# Patient Record
Sex: Female | Born: 1995 | Race: Asian | Hispanic: No | Marital: Single
Health system: Midwestern US, Community
[De-identification: ages and names within clinical notes are randomized; demographics above are authoritative.]

## PROBLEM LIST (undated history)

## (undated) ENCOUNTER — Inpatient Hospital Stay (HOSPITAL_COMMUNITY): Payer: Medicaid Other

## (undated) DIAGNOSIS — F329 Major depressive disorder, single episode, unspecified: Secondary | ICD-10-CM

## (undated) DIAGNOSIS — D649 Anemia, unspecified: Secondary | ICD-10-CM

## (undated) DIAGNOSIS — F32A Depression, unspecified: Secondary | ICD-10-CM

## (undated) HISTORY — PX: NO PAST SURGERIES: SHX2092

---
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
## 2014-04-22 ENCOUNTER — Other Ambulatory Visit (HOSPITAL_COMMUNITY): Payer: Self-pay | Admitting: Nurse Practitioner

## 2014-04-22 DIAGNOSIS — Z3689 Encounter for other specified antenatal screening: Secondary | ICD-10-CM

## 2014-04-22 LAB — OB RESULTS CONSOLE HGB/HCT, BLOOD
HEMATOCRIT: 33 %
Hemoglobin: 11.2 g/dL

## 2014-04-22 LAB — OB RESULTS CONSOLE ABO/RH: RH Type: POSITIVE

## 2014-04-22 LAB — OB RESULTS CONSOLE PLATELET COUNT: PLATELETS: 241 10*3/uL

## 2014-04-22 LAB — OB RESULTS CONSOLE VARICELLA ZOSTER ANTIBODY, IGG: VARICELLA IGG: IMMUNE

## 2014-04-22 LAB — OB RESULTS CONSOLE RPR: RPR: NONREACTIVE

## 2014-04-22 LAB — CYSTIC FIBROSIS DIAGNOSTIC STUDY: Interpretation-CFDNA:: NEGATIVE

## 2014-04-22 LAB — OB RESULTS CONSOLE GC/CHLAMYDIA
CHLAMYDIA, DNA PROBE: NEGATIVE
Gonorrhea: NEGATIVE

## 2014-04-22 LAB — OB RESULTS CONSOLE ANTIBODY SCREEN: ANTIBODY SCREEN: NEGATIVE

## 2014-04-22 LAB — OB RESULTS CONSOLE HEPATITIS B SURFACE ANTIGEN: Hepatitis B Surface Ag: NEGATIVE

## 2014-04-22 LAB — OB RESULTS CONSOLE RUBELLA ANTIBODY, IGM: RUBELLA: IMMUNE

## 2014-04-22 LAB — OB RESULTS CONSOLE HIV ANTIBODY (ROUTINE TESTING): HIV: NONREACTIVE

## 2014-04-26 ENCOUNTER — Other Ambulatory Visit (HOSPITAL_COMMUNITY): Payer: Self-pay | Admitting: Nurse Practitioner

## 2014-04-26 ENCOUNTER — Ambulatory Visit (HOSPITAL_COMMUNITY)
Admission: RE | Admit: 2014-04-26 | Discharge: 2014-04-26 | Disposition: A | Discharge status: Home / Self Care | Payer: Medicaid Other | Source: Ambulatory Visit | Attending: Nurse Practitioner | Admitting: Nurse Practitioner

## 2014-04-26 ENCOUNTER — Encounter (HOSPITAL_COMMUNITY): Payer: Self-pay

## 2014-04-26 DIAGNOSIS — Z3689 Encounter for other specified antenatal screening: Secondary | ICD-10-CM | POA: Insufficient documentation

## 2014-05-02 ENCOUNTER — Other Ambulatory Visit (HOSPITAL_COMMUNITY): Payer: Self-pay | Admitting: Nurse Practitioner

## 2014-05-02 DIAGNOSIS — Z0374 Encounter for suspected problem with fetal growth ruled out: Secondary | ICD-10-CM

## 2014-05-20 ENCOUNTER — Encounter: Payer: Self-pay | Admitting: Family

## 2014-05-20 ENCOUNTER — Encounter: Payer: Medicaid Other | Admitting: Family

## 2014-05-29 ENCOUNTER — Encounter: Payer: Self-pay | Admitting: *Deleted

## 2014-05-31 ENCOUNTER — Ambulatory Visit (HOSPITAL_COMMUNITY): Admission: RE | Admit: 2014-05-31 | Payer: Medicaid Other | Source: Ambulatory Visit

## 2014-12-13 NOTE — L&D Delivery Note (Signed)
Misty Stanley, MD performed the delivery.   Delivery Note At 7:36 PM a viable and healthy female was delivered via Vaginal, Spontaneous Delivery (Presentation: ; Occiput Anterior).  APGAR: 8, 9; weight pending.   Placenta status: Intact, Spontaneous.  Cord: 3 vessels with the following complications: None.  Cord pH: N/A  Anesthesia: Epidural  Episiotomy: None Lacerations: None Suture Repair: none Est. Blood Loss (mL): 100  Mom to postpartum.  Baby to Couplet care / Skin to Skin.  De Hollingshead 07/16/2015, 8:32 PM

## 2015-02-27 ENCOUNTER — Encounter: Payer: Medicaid Other | Admitting: Physician Assistant

## 2015-03-28 ENCOUNTER — Encounter (HOSPITAL_COMMUNITY): Payer: Self-pay | Admitting: *Deleted

## 2015-04-14 ENCOUNTER — Other Ambulatory Visit (HOSPITAL_COMMUNITY): Payer: Self-pay | Admitting: Urology

## 2015-04-14 DIAGNOSIS — O0933 Supervision of pregnancy with insufficient antenatal care, third trimester: Secondary | ICD-10-CM

## 2015-04-14 DIAGNOSIS — Z3689 Encounter for other specified antenatal screening: Secondary | ICD-10-CM

## 2015-04-14 LAB — OB RESULTS CONSOLE HIV ANTIBODY (ROUTINE TESTING): HIV: NONREACTIVE

## 2015-04-14 LAB — OB RESULTS CONSOLE GC/CHLAMYDIA
Chlamydia: NEGATIVE
GC PROBE AMP, GENITAL: NEGATIVE

## 2015-04-14 LAB — OB RESULTS CONSOLE GBS: GBS: POSITIVE

## 2015-04-14 LAB — OB RESULTS CONSOLE RPR: RPR: NONREACTIVE

## 2015-04-18 ENCOUNTER — Ambulatory Visit (HOSPITAL_COMMUNITY)
Admission: RE | Admit: 2015-04-18 | Discharge: 2015-04-18 | Disposition: A | Discharge status: Home / Self Care | Payer: Medicaid Other | Source: Ambulatory Visit | Attending: Urology | Admitting: Urology

## 2015-04-18 DIAGNOSIS — Z3A27 27 weeks gestation of pregnancy: Secondary | ICD-10-CM | POA: Insufficient documentation

## 2015-04-18 DIAGNOSIS — O0932 Supervision of pregnancy with insufficient antenatal care, second trimester: Secondary | ICD-10-CM | POA: Diagnosis not present

## 2015-04-18 DIAGNOSIS — Z36 Encounter for antenatal screening of mother: Secondary | ICD-10-CM | POA: Diagnosis not present

## 2015-04-18 DIAGNOSIS — O0933 Supervision of pregnancy with insufficient antenatal care, third trimester: Secondary | ICD-10-CM

## 2015-04-18 DIAGNOSIS — Z3689 Encounter for other specified antenatal screening: Secondary | ICD-10-CM

## 2015-05-23 ENCOUNTER — Ambulatory Visit (HOSPITAL_COMMUNITY)
Admission: RE | Admit: 2015-05-23 | Discharge: 2015-05-23 | Disposition: A | Discharge status: Home / Self Care | Payer: Medicaid Other | Source: Ambulatory Visit | Attending: Nurse Practitioner | Admitting: Nurse Practitioner

## 2015-05-23 DIAGNOSIS — Z0374 Encounter for suspected problem with fetal growth ruled out: Secondary | ICD-10-CM | POA: Diagnosis not present

## 2015-06-19 LAB — OB RESULTS CONSOLE GC/CHLAMYDIA
Chlamydia: NEGATIVE
GC PROBE AMP, GENITAL: NEGATIVE

## 2015-06-26 ENCOUNTER — Emergency Department (HOSPITAL_COMMUNITY)
Admission: EM | Admit: 2015-06-26 | Discharge: 2015-06-26 | Disposition: A | Discharge status: Home / Self Care | Payer: Medicaid Other | Attending: Emergency Medicine | Admitting: Emergency Medicine

## 2015-06-26 ENCOUNTER — Encounter (HOSPITAL_COMMUNITY): Payer: Self-pay | Admitting: Emergency Medicine

## 2015-06-26 DIAGNOSIS — D649 Anemia, unspecified: Secondary | ICD-10-CM

## 2015-06-26 DIAGNOSIS — R1084 Generalized abdominal pain: Secondary | ICD-10-CM | POA: Insufficient documentation

## 2015-06-26 DIAGNOSIS — O99013 Anemia complicating pregnancy, third trimester: Secondary | ICD-10-CM | POA: Insufficient documentation

## 2015-06-26 DIAGNOSIS — Z3A37 37 weeks gestation of pregnancy: Secondary | ICD-10-CM | POA: Insufficient documentation

## 2015-06-26 DIAGNOSIS — O26899 Other specified pregnancy related conditions, unspecified trimester: Secondary | ICD-10-CM

## 2015-06-26 DIAGNOSIS — O9989 Other specified diseases and conditions complicating pregnancy, childbirth and the puerperium: Secondary | ICD-10-CM | POA: Diagnosis not present

## 2015-06-26 DIAGNOSIS — M549 Dorsalgia, unspecified: Secondary | ICD-10-CM | POA: Insufficient documentation

## 2015-06-26 LAB — TYPE AND SCREEN
ABO/RH(D): A POS
Antibody Screen: NEGATIVE

## 2015-06-26 LAB — CBC
HCT: 20.2 % — ABNORMAL LOW (ref 36.0–46.0)
Hemoglobin: 6.1 g/dL — CL (ref 12.0–15.0)
MCH: 21.8 pg — ABNORMAL LOW (ref 26.0–34.0)
MCHC: 30.2 g/dL (ref 30.0–36.0)
MCV: 72.1 fL — AB (ref 78.0–100.0)
Platelets: 127 10*3/uL — ABNORMAL LOW (ref 150–400)
RBC: 2.8 MIL/uL — ABNORMAL LOW (ref 3.87–5.11)
RDW: 18.5 % — AB (ref 11.5–15.5)
WBC: 6.6 10*3/uL (ref 4.0–10.5)

## 2015-06-26 LAB — ABO/RH: ABO/RH(D): A POS

## 2015-06-26 MED ORDER — SODIUM CHLORIDE 0.9 % IV BOLUS (SEPSIS)
1000.0000 mL | Freq: Once | INTRAVENOUS | Status: AC
  Administered 2015-06-26: 1000 mL via INTRAVENOUS

## 2015-06-26 MED ORDER — SODIUM CHLORIDE 0.9 % IV SOLN
510.0000 mg | Freq: Once | INTRAVENOUS | Status: AC
  Administered 2015-06-26: 510 mg via INTRAVENOUS
  Filled 2015-06-26: qty 17

## 2015-06-26 NOTE — ED Notes (Signed)
OB rapid response at bedside 

## 2015-06-26 NOTE — Discharge Instructions (Signed)
You had abdominal pain. This may be related to contractions.  [facility] have any new or worsening symptoms. You are also found to be anemic. This is likely related to your pregnancy.  Abdominal Pain During Pregnancy Abdominal pain is common in pregnancy. Most of the time, it does not cause harm. There are many causes of abdominal pain. Some causes are more serious than others. Some of the causes of abdominal pain in pregnancy are easily diagnosed. Occasionally, the diagnosis takes time to understand. Other times, the cause is not determined. Abdominal pain can be a sign that something is very wrong with the pregnancy, or the pain may have nothing to do with the pregnancy at all. For this reason, always tell your health care provider if you have any abdominal discomfort. HOME CARE INSTRUCTIONS  Monitor your abdominal pain for any changes. The following actions may help to alleviate any discomfort you are experiencing:  Do not have sexual intercourse or put anything in your vagina until your symptoms go away completely.  Get plenty of rest until your pain improves.  Drink clear fluids if you feel nauseous. Avoid solid food as long as you are uncomfortable or nauseous.  Only take over-the-counter or prescription medicine as directed by your health care provider.  Keep all follow-up appointments with your health care provider. SEEK IMMEDIATE MEDICAL CARE IF:  You are bleeding, leaking fluid, or passing tissue from the vagina.  You have increasing pain or cramping.  You have persistent vomiting.  You have painful or bloody urination.  You have a fever.  You notice a decrease in your baby's movements.  You have extreme weakness or feel faint.  You have shortness of breath, with or without abdominal pain.  You develop a severe headache with abdominal pain.  You have abnormal vaginal discharge with abdominal pain.  You have persistent diarrhea.  You have  abdominal pain that continues even after rest, or gets worse. MAKE SURE YOU:   Understand these instructions.  Will watch your condition.  Will get help right away if you are not doing well or get worse. Document Released: 11/29/2005 Document Revised: 09/19/2013 Document Reviewed: 06/28/2013 [facility] Patient Information 2015 [place], [place]. This information is not intended to replace advice given to you by your health care provider. Make sure you discuss any questions you have with your health care provider.

## 2015-06-26 NOTE — ED Provider Notes (Signed)
CSN: [id number]     Arrival date & time 06/26/15  0004 History  This chart was scribed for  Shon Baton, MD by Bethel Born, ED Scribe. This patient was seen in room D36C/D36C and the patient's care was started at 12:12 AM.    Chief Complaint  Patient presents with  . Contractions    The history is provided by the patient. A language interpreter was used.   Krisanne Lich is a 19 y.o. G2P1 female who is 9 months pregnant presenting to the Emergency Department complaining of "intolerable" abdominal pain with onset around 5 PM. The constant pain feels like contractions. The pt had a bowel movement and notes that it felt like the baby was coming. No blood or fluid loss. Associated symptoms include back pain and breast pain. G2P1. Her first pregnancy was uncomplicated. She has been being treated for this pregnancy at the [facility]. EGA 07/17/15.     History reviewed. No pertinent past medical history. History reviewed. No pertinent past surgical history. No family history on file. History  Substance Use Topics  . Smoking status: Not on file  . Smokeless tobacco: Not on file  . Alcohol Use: Not on file   OB History    Gravida Para Term Preterm AB TAB SAB Ectopic Multiple Living   2              Review of Systems  Constitutional: Negative for fever.  Respiratory: Negative for chest tightness and shortness of breath.   Cardiovascular: Negative for chest pain.  Gastrointestinal: Positive for abdominal pain. Negative for nausea and vomiting.  Genitourinary: Negative for dysuria, vaginal bleeding and vaginal discharge.  Musculoskeletal: Positive for back pain.  All other systems reviewed and are negative.     Allergies  Review of patient's allergies indicates no known allergies.  Home Medications   Prior to Admission medications   Not on File   Triage Vitals: BP 101/61 mmHg  Pulse 96  Temp(Src) 98.5 F (36.9 C) (Oral)  Resp 18  SpO2 99% Physical  Exam  Constitutional: She is oriented to person, place, and time. She appears well-developed and well-nourished. No distress.  HENT:  Head: Normocephalic and atraumatic.  Cardiovascular: Normal rate, regular rhythm and normal heart sounds.   No murmur heard. Pulmonary/Chest: Effort normal and breath sounds normal. No respiratory distress. She has no wheezes.  Abdominal: Soft. Bowel sounds are normal. There is no tenderness. There is no rebound and no guarding.  Gravid, no contractions palpated  Genitourinary:  Deferred to OB nurse  Neurological: She is alert and oriented to person, place, and time.  Skin: Skin is warm and dry.  Psychiatric: She has a normal mood and affect.  Nursing note and vitals reviewed.   ED Course  Procedures  DIAGNOSTIC STUDIES: Oxygen Saturation is 99% on RA, normal by my interpretation.    COORDINATION OF CARE:   Labs Review Labs Reviewed  CBC - Abnormal; Notable for the following:    RBC 2.80 (*)    Hemoglobin 6.1 (*)    HCT 20.2 (*)    MCV 72.1 (*)    MCH 21.8 (*)    RDW 18.5 (*)    Platelets 127 (*)    All other components within normal limits  TYPE AND SCREEN  ABO/RH    Imaging Review No results found.   EKG Interpretation None      MDM   Final diagnoses:  Pregnancy with generalized abdominal pain, antepartum  Anemia, unspecified anemia type   Patient presents with abdominal pain. Is approximately [redacted] weeks pregnant. Fetal heart rate in the 140s on the monitor. Per rapid response OB nurse, patient's cervical exam 2 cm dilated and 70% effaced.   She does appear to be having some contractions but not at regular intervals. Fetal heart rate and uterine irritability improved with IV fluids. Patient noted to be anemic. Unknown baseline. OB nurse discussed with Dr. Emelda FearFerguson on call for OB.  Patient was supplemented with a iron supplement. Patient to follow-up in 2 weeks as instructed by OB. If she has any new or worsening symptoms, she  should go to the women's hospital.. Discharge instructions and precautions were discussed at the bedside by the rapid response nurse with a translator.  After history, exam, and medical workup I feel the patient has been appropriately medically screened and is safe for discharge home. Pertinent diagnoses were discussed with the patient. Patient was given return precautions.  I personally performed the services described in this documentation, which was scribed in my presence. The recorded information has been reviewed and is accurate.      Shon Batonourtney F Vanecia Limpert, MD 06/26/15 [phone]

## 2015-06-26 NOTE — Progress Notes (Signed)
Pt came in c/o contractions. FHT 140/reactive/occ mild variable, Contractions 2-8 minutes, SVE 2/60/-3. Hgb 6.2. Hydrated w/1 liter NS. Dr. Emelda FearFerguson notified, he ordered Feraheme, 510. Reviewed all information with patient and husband, via Publishing rights manageracifica translator. Pt stated she feels much better. 0245-FHT 135/reactive/no decels. Contractions 4-10, mild intensity. OK to discharge home per Dr. Emelda FearFerguson.

## 2015-06-26 NOTE — ED Notes (Signed)
OB Nurse Called, in route.

## 2015-06-26 NOTE — ED Notes (Signed)
Pt reports continuous abdominal pain since 5 pm. Pt is pregnant- due date aug. 4. Pt sts she felt like she had to have a bm and pain became worse. Denies loss of fluid or vaginal bleeding. Fetal heart tones 150's.

## 2015-07-16 ENCOUNTER — Inpatient Hospital Stay (HOSPITAL_COMMUNITY)
Admission: AD | Admit: 2015-07-16 | Discharge: 2015-07-18 | DRG: 774 | Disposition: A | Discharge status: Home / Self Care | Payer: Medicaid Other | Source: Ambulatory Visit | Attending: Obstetrics & Gynecology | Admitting: Obstetrics & Gynecology

## 2015-07-16 ENCOUNTER — Inpatient Hospital Stay (HOSPITAL_COMMUNITY): Payer: Medicaid Other | Admitting: Anesthesiology

## 2015-07-16 ENCOUNTER — Encounter (HOSPITAL_COMMUNITY): Payer: Self-pay | Admitting: *Deleted

## 2015-07-16 DIAGNOSIS — Z3490 Encounter for supervision of normal pregnancy, unspecified, unspecified trimester: Secondary | ICD-10-CM

## 2015-07-16 DIAGNOSIS — O8612 Endometritis following delivery: Secondary | ICD-10-CM | POA: Diagnosis not present

## 2015-07-16 DIAGNOSIS — O0933 Supervision of pregnancy with insufficient antenatal care, third trimester: Secondary | ICD-10-CM | POA: Diagnosis not present

## 2015-07-16 DIAGNOSIS — D649 Anemia, unspecified: Secondary | ICD-10-CM | POA: Diagnosis not present

## 2015-07-16 DIAGNOSIS — O99824 Streptococcus B carrier state complicating childbirth: Secondary | ICD-10-CM | POA: Diagnosis present

## 2015-07-16 DIAGNOSIS — N719 Inflammatory disease of uterus, unspecified: Secondary | ICD-10-CM | POA: Diagnosis present

## 2015-07-16 DIAGNOSIS — Z3A39 39 weeks gestation of pregnancy: Secondary | ICD-10-CM | POA: Diagnosis present

## 2015-07-16 DIAGNOSIS — O9081 Anemia of the puerperium: Secondary | ICD-10-CM | POA: Diagnosis not present

## 2015-07-16 HISTORY — DX: Anemia, unspecified: D64.9

## 2015-07-16 LAB — CBC
HCT: 30.6 % — ABNORMAL LOW (ref 36.0–46.0)
HEMOGLOBIN: 9.2 g/dL — AB (ref 12.0–15.0)
MCH: 24.9 pg — AB (ref 26.0–34.0)
MCHC: 30.1 g/dL (ref 30.0–36.0)
MCV: 82.9 fL (ref 78.0–100.0)
PLATELETS: 223 10*3/uL (ref 150–400)
RBC: 3.69 MIL/uL — ABNORMAL LOW (ref 3.87–5.11)
RDW: 30.3 % — AB (ref 11.5–15.5)
WBC: 6.8 10*3/uL (ref 4.0–10.5)

## 2015-07-16 LAB — TYPE AND SCREEN
ABO/RH(D): A POS
ANTIBODY SCREEN: NEGATIVE

## 2015-07-16 LAB — ABO/RH: ABO/RH(D): A POS

## 2015-07-16 MED ORDER — SENNOSIDES-DOCUSATE SODIUM 8.6-50 MG PO TABS
2.0000 | ORAL_TABLET | ORAL | Status: DC
  Administered 2015-07-17 (×2): 2 via ORAL
  Filled 2015-07-16 (×2): qty 2

## 2015-07-16 MED ORDER — PENICILLIN G POTASSIUM 5000000 UNITS IJ SOLR
5.0000 10*6.[IU] | Freq: Once | INTRAVENOUS | Status: AC
  Administered 2015-07-16: 5 10*6.[IU] via INTRAVENOUS
  Filled 2015-07-16: qty 5

## 2015-07-16 MED ORDER — OXYTOCIN 40 UNITS IN LACTATED RINGERS INFUSION - SIMPLE MED: 62.5000 mL/h | INTRAVENOUS | Status: DC

## 2015-07-16 MED ORDER — FENTANYL 2.5 MCG/ML BUPIVACAINE 1/10 % EPIDURAL INFUSION (WH - ANES)
14.0000 mL/h | INTRAMUSCULAR | Status: DC | PRN
  Administered 2015-07-16 (×2): 14 mL/h via EPIDURAL
  Filled 2015-07-16: qty 125

## 2015-07-16 MED ORDER — PHENYLEPHRINE 40 MCG/ML (10ML) SYRINGE FOR IV PUSH (FOR BLOOD PRESSURE SUPPORT)
80.0000 ug | PREFILLED_SYRINGE | INTRAVENOUS | Status: DC | PRN
  Filled 2015-07-16: qty 20

## 2015-07-16 MED ORDER — LIDOCAINE HCL (PF) 1 % IJ SOLN
30.0000 mL | INTRAMUSCULAR | Status: DC | PRN
  Filled 2015-07-16: qty 30

## 2015-07-16 MED ORDER — ONDANSETRON HCL 4 MG PO TABS: 4.0000 mg | ORAL_TABLET | ORAL | Status: DC | PRN

## 2015-07-16 MED ORDER — DIPHENHYDRAMINE HCL 50 MG/ML IJ SOLN
12.5000 mg | INTRAMUSCULAR | Status: DC | PRN
Start: 2015-07-16 — End: 2015-07-16

## 2015-07-16 MED ORDER — ZOLPIDEM TARTRATE 5 MG PO TABS: 5.0000 mg | ORAL_TABLET | Freq: Every evening | ORAL | Status: DC | PRN

## 2015-07-16 MED ORDER — PRENATAL MULTIVITAMIN CH
1.0000 | ORAL_TABLET | Freq: Every day | ORAL | Status: DC
  Administered 2015-07-17 – 2015-07-18 (×2): 1 via ORAL
  Filled 2015-07-16 (×2): qty 1

## 2015-07-16 MED ORDER — OXYCODONE-ACETAMINOPHEN 5-325 MG PO TABS
1.0000 | ORAL_TABLET | ORAL | Status: DC | PRN
Start: 2015-07-16 — End: 2015-07-16

## 2015-07-16 MED ORDER — ACETAMINOPHEN 325 MG PO TABS
650.0000 mg | ORAL_TABLET | ORAL | Status: DC | PRN
  Administered 2015-07-16: 650 mg via ORAL
  Filled 2015-07-16: qty 2

## 2015-07-16 MED ORDER — BENZOCAINE-MENTHOL 20-0.5 % EX AERO
1.0000 "application " | INHALATION_SPRAY | CUTANEOUS | Status: DC | PRN
  Filled 2015-07-16: qty 56

## 2015-07-16 MED ORDER — ONDANSETRON HCL 4 MG/2ML IJ SOLN: 4.0000 mg | INTRAMUSCULAR | Status: DC | PRN

## 2015-07-16 MED ORDER — SIMETHICONE 80 MG PO CHEW: 80.0000 mg | CHEWABLE_TABLET | ORAL | Status: DC | PRN

## 2015-07-16 MED ORDER — LACTATED RINGERS IV SOLN: INTRAVENOUS | Status: DC

## 2015-07-16 MED ORDER — LANOLIN HYDROUS EX OINT: TOPICAL_OINTMENT | CUTANEOUS | Status: DC | PRN

## 2015-07-16 MED ORDER — FLEET ENEMA 7-19 GM/118ML RE ENEM: 1.0000 | ENEMA | RECTAL | Status: DC | PRN

## 2015-07-16 MED ORDER — EPHEDRINE 5 MG/ML INJ: 10.0000 mg | INTRAVENOUS | Status: DC | PRN

## 2015-07-16 MED ORDER — OXYTOCIN BOLUS FROM INFUSION
500.0000 mL | INTRAVENOUS | Status: DC
  Administered 2015-07-16: 500 mL via INTRAVENOUS

## 2015-07-16 MED ORDER — MISOPROSTOL 200 MCG PO TABS
1000.0000 ug | ORAL_TABLET | Freq: Once | ORAL | Status: AC
  Administered 2015-07-16: 1000 ug via ORAL

## 2015-07-16 MED ORDER — OXYCODONE-ACETAMINOPHEN 5-325 MG PO TABS: 2.0000 | ORAL_TABLET | ORAL | Status: DC | PRN

## 2015-07-16 MED ORDER — PENICILLIN G POTASSIUM 5000000 UNITS IJ SOLR
2.5000 10*6.[IU] | INTRAVENOUS | Status: DC
  Administered 2015-07-16: 2.5 10*6.[IU] via INTRAVENOUS
  Filled 2015-07-16 (×3): qty 2.5

## 2015-07-16 MED ORDER — DIPHENHYDRAMINE HCL 25 MG PO CAPS: 25.0000 mg | ORAL_CAPSULE | Freq: Four times a day (QID) | ORAL | Status: DC | PRN

## 2015-07-16 MED ORDER — WITCH HAZEL-GLYCERIN EX PADS: 1.0000 "application " | MEDICATED_PAD | CUTANEOUS | Status: DC | PRN

## 2015-07-16 MED ORDER — CITRIC ACID-SODIUM CITRATE 334-500 MG/5ML PO SOLN: 30.0000 mL | ORAL | Status: DC | PRN

## 2015-07-16 MED ORDER — DIBUCAINE 1 % RE OINT
1.0000 "application " | TOPICAL_OINTMENT | RECTAL | Status: DC | PRN
  Filled 2015-07-16: qty 28

## 2015-07-16 MED ORDER — MISOPROSTOL 200 MCG PO TABS
ORAL_TABLET | ORAL | Status: AC
  Filled 2015-07-16: qty 5

## 2015-07-16 MED ORDER — LACTATED RINGERS IV SOLN: 500.0000 mL | INTRAVENOUS | Status: DC | PRN

## 2015-07-16 MED ORDER — ACETAMINOPHEN 325 MG PO TABS: 650.0000 mg | ORAL_TABLET | ORAL | Status: DC | PRN

## 2015-07-16 MED ORDER — TETANUS-DIPHTH-ACELL PERTUSSIS 5-2.5-18.5 LF-MCG/0.5 IM SUSP
0.5000 mL | Freq: Once | INTRAMUSCULAR | Status: AC
  Administered 2015-07-18: 0.5 mL via INTRAMUSCULAR
  Filled 2015-07-16 (×2): qty 0.5

## 2015-07-16 MED ORDER — TERBUTALINE SULFATE 1 MG/ML IJ SOLN: 0.2500 mg | Freq: Once | INTRAMUSCULAR | Status: DC | PRN

## 2015-07-16 MED ORDER — ONDANSETRON HCL 4 MG/2ML IJ SOLN: 4.0000 mg | Freq: Four times a day (QID) | INTRAMUSCULAR | Status: DC | PRN

## 2015-07-16 MED ORDER — OXYTOCIN 40 UNITS IN LACTATED RINGERS INFUSION - SIMPLE MED
1.0000 m[IU]/min | INTRAVENOUS | Status: DC
  Administered 2015-07-16: 2 m[IU]/min via INTRAVENOUS
  Filled 2015-07-16: qty 1000

## 2015-07-16 MED ORDER — OXYTOCIN 40 UNITS IN LACTATED RINGERS INFUSION - SIMPLE MED
INTRAVENOUS | Status: AC
  Filled 2015-07-16: qty 1000

## 2015-07-16 MED ORDER — LIDOCAINE HCL (PF) 1 % IJ SOLN
INTRAMUSCULAR | Status: DC | PRN
  Administered 2015-07-16: 2 mL via EPIDURAL
  Administered 2015-07-16: 5 mL via EPIDURAL
  Administered 2015-07-16: 3 mL via EPIDURAL

## 2015-07-16 MED ORDER — IBUPROFEN 600 MG PO TABS
600.0000 mg | ORAL_TABLET | Freq: Four times a day (QID) | ORAL | Status: DC
  Administered 2015-07-16 – 2015-07-18 (×7): 600 mg via ORAL
  Filled 2015-07-16 (×7): qty 1

## 2015-07-16 NOTE — Anesthesia Procedure Notes (Signed)
Epidural Patient location during procedure: OB  Staffing Anesthesiologist: Cecile Hearing Performed by: anesthesiologist   Preanesthetic Checklist Completed: patient identified, pre-op evaluation, timeout performed, IV checked, risks and benefits discussed and monitors and equipment checked  Epidural Patient position: sitting Prep: DuraPrep Patient monitoring: blood pressure and continuous pulse ox Approach: midline Location: L3-L4 Injection technique: LOR air  Needle:  Needle type: Tuohy  Needle gauge: 17 G Needle length: 9 cm Needle insertion depth: 4 cm Catheter size: 19 Gauge Catheter at skin depth: 9 cm Test dose: negative and Other (1% Lidocaine)  Additional Notes Patient identified.  Risk benefits discussed including failed block, incomplete pain control, headache, nerve damage, paralysis, blood pressure changes, nausea, vomiting, reactions to medication both toxic or allergic, and postpartum back pain.  Patient expressed understanding and wished to proceed.  All questions were answered.  Sterile technique used throughout procedure and epidural site dressed with sterile barrier dressing. No paresthesia or other complications noted. The patient did not experience any signs of intravascular injection such as tinnitus or metallic taste in mouth nor signs of intrathecal spread such as rapid motor block. Please see nursing notes for vital signs.  Interpreter [phone] used throughout consent and procedure.Reason for block:procedure for pain

## 2015-07-16 NOTE — H&P (Signed)
LABOR ADMISSION HISTORY AND PHYSICAL  Janet [name] is a 19 y.o. female G2P1001 with IUP at [redacted]w[redacted]d by U/S presenting for onset of labor. GBS positive, denies any past allergic reactions to antibiotics. OB history significant for limited prenatal care and short duration between pregnancies (previous child delivered vaginally 06/2014).  Of note, pt has hemoglobin of 6.1 noted 05/23/15, she has been taking iron and H/H increased to 7.2/24.5 per outside records from health department.  Was sent from health department after she experienced watery discharge from vagina with positive fern test today.  Denies any vaginal bleeding.    Dating: By ultrasound --->  Estimated Date of Delivery: 07/16/15  She reports +FMs, No LOF, no VB, no blurry vision, headaches or peripheral edema, and RUQ pain.   She plans on breastfeeding. She requests Nexplanon for birth control.  Sono:   -04/18/15 anatomy U/S, gestational age @[redacted]w[redacted]d  by ultrasound w/ EDD 07/16/2015, normal anatomy, cephalic presentation, 1056g, 16% EFW, placenta fundal above cervical os, note of "left low risk urinary tract dilation, UTDA (previously pylectasis) of 5mm.  Female fetus, normal appearing bladder and amniotic fluid volume.  This is a normal variant that may require postnatal imaging if it persists."    -05/23/15 follow up U/S showed cephalic presentation, 1779, 41% EFW, placenta anterior above cervical os, normal renal pelves, appropriate fetal interval growth, normal amniotic fluid volume  Prenatal History/Complications: -Limited prenatal care -Short interval between pregnancies (delivered previous child 06/2014) -Prior pregnancy with polyhydramnios and two vessel cord (apparent isolated finding) noted on detailed fetal anatomy U/S in third trimester  Past Medical History: Past Medical History  Diagnosis Date  . Anemia     Past Surgical History: History reviewed. No pertinent past surgical history.  Obstetrical History: OB History    Gravida Para  Term Preterm AB TAB SAB Ectopic Multiple Living   2 1  0 0 0 0 0 0 1      Social History: History   Social History  . Marital Status: Married    Spouse Name: N/A  . Number of Children: N/A  . Years of Education: N/A   Social History Main Topics  . Smoking status: Not on file  . Smokeless tobacco: Not on file  . Alcohol Use: Not on file  . Drug Use: Not on file  . Sexual Activity: Not on file   Other Topics Concern  . None   Social History Narrative   Family History: History reviewed. No pertinent family history.  Allergies: No Known Allergies  No prescriptions prior to admission   Review of Systems  All systems reviewed and negative except as stated in HPI  Blood pressure 107/65, pulse 83, temperature 98.8 F (37.1 C), temperature source Oral, height 5\' 2"  (1.575 m), weight 67.586 kg (149 lb), unknown if currently breastfeeding. General appearance: alert, cooperative, appears stated age and no distress Lungs: clear to auscultation bilaterally Heart: regular rate and rhythm, no murmur Abdomen: soft, non-tender; gravid uterus consistent with dates Pelvic: Deferred to L&D nurse  Extremities: No edema Presentation: cephalic per ultrasound 6/10 Fetal monitoringBaseline: 130 bpm, Variability: Good {> 6 bpm), Accelerations: Reactive and Decelerations: Absent Uterine activity: Infrequent Dilation: 4 Effacement (%): 100 Station: -1 Exam by:: lee  Prenatal labs: ABO, Rh: --/--/A POS, A POS (07/14 0030) Antibody: NEG (07/14 0030) Rubella:   Immune RPR: Nonreactive (05/02 0000)  HBsAg:    Neg HIV: Non-reactive (05/02 0000)  GBS: Positive (05/02 0000)  1 hr Glucola:93 Genetic screening: CF screen negative,  no QUAD screen documented Anatomy US: done 04/18/15 , 2 days, see above  Prenatal Transfer Tool  Maternal Diabetes: No Genetic Screening: CF screening negative, no QUAD screen results noted in outside records Maternal Ultrasounds/Referrals: Abnormal:   Findings:   Fetal renal pyelectasis Fetal Ultrasounds or other Referrals:  None Maternal Substance Abuse:  No Significant Maternal Medications:  None Significant Maternal Lab Results: Lab values include: Group B Strep positive, Other: HgB 6.1 on 05/23/15, up to 7.2 with hct 24.5 on 7/18 per [facility] records  No results found for this or any previous visit (from the past 24 hour(s)).  Patient Active Problem List   Diagnosis Date Noted  . Pregnancy 07/16/2015  . [redacted] weeks gestation of pregnancy   . Encounter for fetal anatomic survey     Assessment: Janet [name] is a 19 y.o. G2P1001 at [redacted]w[redacted]d here for spontaneous onset of labor.  #Labor: Expectant management with pitocin augmentation #Pain: Epidural #FWB:  Category 1 strip #ID: GBS prophylaxis initiated #MOF: Intents to breastfeed #MOC: Nexplanon #Circ:  Does not desire  The above information was documented with assistance from Retta Mac, MS3.   Retta Mac 07/16/2015, 2:25 PM   OB FELLOW HISTORY AND PHYSICAL ATTESTATION  I have seen and examined this patient; I agree with above documentation in the medical student's note.   Janet [name] is a 19 y.o. G2P1001 here for onset of spontaneous labor.  PE: BP 114/65 mmHg  Pulse 73  Temp(Src) 98.8 F (37.1 C) (Oral)  Resp 16  Ht  (1.575 m)  Wt 149 lb (67.586 kg)  BMI 27.25 kg/m2  SpO2 99% Gen: calm comfortable, NAD Resp: normal effort, no distress Abd: gravid  ROS, labs, PMH reviewed. GBS positive.  Plan: Admit to L & D, penicillin while in labor, check CBC given hx significant anemia treated with oral iron, and will plan to repeat ppd1 CBC.  Cherrie Gauze Aubriee Szeto 07/16/2015, 8:31 PM

## 2015-07-16 NOTE — Progress Notes (Signed)
RN ON PHONE WITH DR. ALLEN(TS) AND NOTIFIED OF TEMP OF 101.9 AXILLARY, AND PT STILL SHIVERING. PT HAS EATEN AND BLEEDING CHECKED AND ICE PACK SATURATED WITH BRIGHT RED BLEEDING AND 2 TINY SMALL CLOTS EXPRESSED. PT SPEAKS NO ENGLISH.

## 2015-07-16 NOTE — Progress Notes (Signed)
Called by RN re pt temp. Will monitor until 6 hrs postpartum and if >100.4, will begin abx.

## 2015-07-16 NOTE — Anesthesia Preprocedure Evaluation (Addendum)
Anesthesia Evaluation  Patient identified by MRN, date of birth, ID band Patient awake    Airway Mallampati: II   Neck ROM: Full    Dental  (+) Teeth Intact   Pulmonary neg pulmonary ROS,  breath sounds clear to auscultation  Pulmonary exam normal       Cardiovascular negative cardio ROS Normal cardiovascular examRhythm:Regular Rate:Normal     Neuro/Psych negative neurological ROS  negative psych ROS   GI/Hepatic negative GI ROS,   Endo/Other    Renal/GU negative Renal ROS     Musculoskeletal   Abdominal   Peds  Hematology  (+) Blood dyscrasia, anemia ,   Anesthesia Other Findings   Reproductive/Obstetrics (+) Pregnancy                             Anesthesia Physical Anesthesia Plan  ASA: II  Anesthesia Plan: Epidural   Post-op Pain Management:    Induction:   Airway Management Planned:   Additional Equipment:   Intra-op Plan:   Post-operative Plan:   Informed Consent: I have reviewed the patients History and Physical, chart, labs and discussed the procedure including the risks, benefits and alternatives for the proposed anesthesia with the patient or authorized representative who has indicated his/her understanding and acceptance.   Dental advisory given  Plan Discussed with: Anesthesiologist  Anesthesia Plan Comments: (I have discussed with patient the risks/benefits of an epidural including: back ache, head ache, paresthesias, failed block, bleeding, infection, nerve damage.  Patient understands the risks/benefits and wishes to proceed with procedure.)       Anesthesia Quick Evaluation

## 2015-07-17 ENCOUNTER — Encounter (HOSPITAL_COMMUNITY): Payer: Self-pay | Admitting: *Deleted

## 2015-07-17 DIAGNOSIS — O99824 Streptococcus B carrier state complicating childbirth: Secondary | ICD-10-CM

## 2015-07-17 DIAGNOSIS — Z3A39 39 weeks gestation of pregnancy: Secondary | ICD-10-CM

## 2015-07-17 DIAGNOSIS — N719 Inflammatory disease of uterus, unspecified: Secondary | ICD-10-CM

## 2015-07-17 DIAGNOSIS — O9081 Anemia of the puerperium: Secondary | ICD-10-CM

## 2015-07-17 DIAGNOSIS — O8612 Endometritis following delivery: Secondary | ICD-10-CM

## 2015-07-17 LAB — CBC
HCT: 26.7 % — ABNORMAL LOW (ref 36.0–46.0)
Hemoglobin: 8.2 g/dL — ABNORMAL LOW (ref 12.0–15.0)
MCH: 25.4 pg — ABNORMAL LOW (ref 26.0–34.0)
MCHC: 30.7 g/dL (ref 30.0–36.0)
MCV: 82.7 fL (ref 78.0–100.0)
Platelets: 193 10*3/uL (ref 150–400)
RBC: 3.23 MIL/uL — ABNORMAL LOW (ref 3.87–5.11)
RDW: 30.7 % — AB (ref 11.5–15.5)
WBC: 8.8 10*3/uL (ref 4.0–10.5)

## 2015-07-17 LAB — RPR: RPR Ser Ql: NONREACTIVE

## 2015-07-17 MED ORDER — LACTATED RINGERS IV SOLN
INTRAVENOUS | Status: DC
  Administered 2015-07-17 (×2): via INTRAVENOUS

## 2015-07-17 MED ORDER — PIPERACILLIN-TAZOBACTAM 3.375 G IVPB
3.3750 g | Freq: Three times a day (TID) | INTRAVENOUS | Status: DC
  Administered 2015-07-17 – 2015-07-18 (×4): 3.375 g via INTRAVENOUS
  Filled 2015-07-17 (×7): qty 50

## 2015-07-17 NOTE — Discharge Instructions (Signed)

## 2015-07-17 NOTE — Progress Notes (Addendum)
Received pt. In room 112 via W/C from labor and delivery RN in room. PT SPEAKING NO English, PT. SPEAKING [place]. FOB UNDERSTANDS ENGLISH, BEEN IN [place] 4 [place] AND SPOKE ENGLISH IN [place].  RN received report from L and D  RN in room . RN began assessment of pt. And found Fundus slightly BOGGY ABOVE , BUT FIRM. CLOTS POOLING OUT OF VAGINA WITH BLOOD. Ice pack saturated with bright red blood and 4 small clots noted 4-2 cms in diameter. New pad applied. RN  CALLED FOR Labor AND Delivery RN TO COME BACK TO PT. ROOM 112.  2145 RN did fundal check again and more clots expressed and bright red bleeding & pooling of clots noted REPORT RECIEVED FROM LABOR AND DELIVERY RN WAS CYTOTEC 1000 MCG GIVEN IN LABOR AND DELIVERY FOR EXCESSIVE BLEEDING. LABOR AND DELIVERY RN UNABLE TO GIVE EBL AT THIS TIME BUT STATED WAS CHARTED. LABOR AND DELIVERY RN IN ROOM AND TAKING CHARGE OF CARE FOR PT. IV PITOCIN GIVEN  BOLUS 500 CC'S AND RESIDENT NOTIFIED.  And RESIDENT IN ROOM.

## 2015-07-17 NOTE — Progress Notes (Signed)
RN in room and pt vs repeated and assessment done. Pt temp 100.4 orally.  Plan to cont. To monitor pt accordingly

## 2015-07-17 NOTE — Progress Notes (Signed)
PT UP WITH RN AMBULATED TO BATHROOM. PT HAD 6 CLOTS SIZE OF 3" X 2" EACH. BLOOD SIZED OF VOLLEY BALL ON BED LINEN NAD LINEN'S CHANGED. PERINEAL PAD SATURATED 1/2 OF PAD. PERINEAL CARE DONE AND NEW PADS APPLIED. IV CONTINUES TO INFUSE.

## 2015-07-17 NOTE — Progress Notes (Signed)
ANTIBIOTIC CONSULT NOTE-Preliminary  Pharmacy Consult for Zosyn Indication: chorio, postpartum elevated temp  No Known Allergies  Patient Measurements: Height:  (157.5 cm) Weight: 149 lb (67.586 kg) IBW/kg (Calculated) : 50.1   Vital Signs: Temp: 100.4 F (38 C) (08/04 0130) Temp Source: Oral (08/04 0130) BP: 102/50 mmHg (08/04 0130) Pulse Rate: 59 (08/04 0130)  Labs:  Recent Labs  07/16/15 1345  WBC 6.8  HGB 9.2*  PLT 223    CrCl cannot be calculated (Patient has no serum creatinine result on file.).  No results for input(s): VANCOTROUGH, VANCOPEAK, VANCORANDOM, GENTTROUGH, GENTPEAK, GENTRANDOM, TOBRATROUGH, TOBRAPEAK, TOBRARND, AMIKACINPEAK, AMIKACINTROU, AMIKACIN in the last 72 hours.   Microbiology: No results found for this or any previous visit (from the past 720 hour(s)).  Medical History: Past Medical History  Diagnosis Date  . Anemia     Medications:  Scheduled:  . ibuprofen  600 mg Oral 4 times per day  . misoprostol      . oxytocin 40 units in LR 1000 mL      . piperacillin-tazobactam (ZOSYN)  IV  3.375 g Intravenous Q8H  . prenatal multivitamin  1 tablet Oral Q1200  . senna-docusate  2 tablet Oral Q24H  . Tdap  0.5 mL Intramuscular Once    Assessment: 19 yo G2P1 delivered at [redacted]w[redacted]d. GBS positive, received PCN. Temp elevated postpartum, decided to start Zosyn.   Goal of Therapy:  eradication of infection  Plan:  Zosyn 3.375 grams IV Q 8 hours   Rosaline Ezekiel Scarlett, RPH 07/17/2015,2:44 AM

## 2015-07-17 NOTE — Lactation Note (Signed)
This note was copied from the chart of Janet [name]. Lactation Consultation Note  Patient Name: Janet [name] [id number] Date: 07/17/2015 Reason for consult: Initial assessment [place] interpreter [phone] used for visit. Mom is BR/BO feeding. 1st time BF. Basic teaching reviewed. Encouraged Mom to BF with each feeding before giving any bottle. Mom reports that she is. Mom reports some mild discomfort with baby nursing. Assisted Mom with positioning to obtain more depth with latch. Mom reported improvement. Advised baby should be at the breast 8-12 times in 24 hours and with feeding ques. Discussed risk of early supplementation to BF success. Lactation brochure left for review, advised of OP services and support group. Encouraged to call for assist.   Maternal Data Has patient been taught Hand Expression?: Yes Does the patient have breastfeeding experience prior to this delivery?: No  Feeding Feeding Type: Breast Fed Length of feed: 5 min  LATCH Score/Interventions Latch: Grasps breast easily, tongue down, lips flanged, rhythmical sucking.  Audible Swallowing: A few with stimulation  Type of Nipple: Everted at rest and after stimulation  Comfort (Breast/Nipple): Filling, red/small blisters or bruises, mild/mod discomfort  Interventions (Mild/moderate discomfort): Hand massage;Hand expression  Hold (Positioning): Assistance needed to correctly position infant at breast and maintain latch. Intervention(s): Breastfeeding basics reviewed;Support Pillows;Position options;Skin to skin  LATCH Score: 7  Lactation Tools Discussed/Used     Consult Status Consult Status: Follow-up Date: 07/18/15 Follow-up type: In-patient    Alfred Levins 07/17/2015, 12:52 PM

## 2015-07-17 NOTE — Progress Notes (Signed)
Post Partum Day #1  Subjective: Adelis Docter is a 19 y.o. G2P1002 [redacted]w[redacted]d s/p SVD of healthy and viable female infant.  Overnight, nurse noted that patient was febrile to 102.37F and patient was started on 3.375g Zosyn q8hr IV.  It was also noted by nurse that patient passed several 2-3cm clots and saturated 1/2 of perineal pad.  Pt was noted to have low Hgb of 9.2 prior to delivery.  When asked specifically using the help of a translator (pt speaks only [place]), she does not have any complaints about vaginal bleeding this morning. Patient has only ambulated so far with help of nurse.  Notes that she is able to urinate.  No flatus or BMs thus far. Eating food from home without any problems.  Pain is well controlled. Lochia Moderate.  Plan for birth control is nexplanon.  Method of Feeding: breast/formula  Objective: Blood pressure 99/54, pulse 67, temperature 98.2 F (36.8 C), temperature source Oral, resp. rate 18, height  (1.575 m), weight 67.586 kg (149 lb), SpO2 99 %, unknown if currently breastfeeding.  Physical Exam:  General: alert, cooperative and no distress, resting comfortably but awakens easily Lochia: small amount of blood noted on perineal pad (last changed around 4am, 3 hours prior to check) Chest: Normal work of breathing Abdomen: soft, nontender.  Uterine Fundus:  Firm uterus best palpated slightly right of midline. DVT Evaluation: No evidence of DVT seen on physical exam. Extremities: No edema   Recent Labs  07/16/15 1345 07/17/15 0540  HGB 9.2* 8.2*  HCT 30.6* 26.7*    Assessment/Plan: ASSESSMENT: Alianna Wurster is a 19 y.o. G2P1002 [redacted]w[redacted]d s/p SVD with known anemia and concern for triple I/endometritis  #Fever, concerning for Suspected Triple I/Endometritis -Continue Zosyn 3.375g q8 IV until patient at least 24-48 hours afebrile   #Anemia -Repeat CBC in Am -Evaluate for signs/symptoms of continued bleeding  #Dispo Contraception Neplanon  -Plan to discharge home over  weekend pending clinical improvement of fever and asymptomatic anemia    LOS: 1 day   The above information was documented with assistance from Retta Mac, MS3.   Retta Mac 07/17/2015, 11:44 AM   OB fellow attestation Post Partum Day #1 I have seen and examined this patient and agree with above documentation in the Medical student's note.   Amylia Collazos is a 19 y.o. [id number] s/p NVSD.  Pt denies problems with ambulating, voiding or po intake. Pain is well controlled.  Plan for birth control is Nexplanon.  Method of Feeding: Formula and Breast  No dizziness/lightheadededness. Denies continued bleeding. Denies fever, chills, nausea.vomiting.  PE:  BP 99/54 mmHg  Pulse 67  Temp(Src) 98.2 F (36.8 C) (Oral)  Resp 18  Ht  (1.575 m)  Wt 149 lb (67.586 kg)  BMI 27.25 kg/m2  SpO2 99%  Breastfeeding? Unknown Fundus firm Peripad: minimal lochia  Plan for discharge: PPD#2 #Suspected Triple I: Continued zosyn until 24 hours hours afebrile #Anemia: Recheck hgb in AM to evaluate for nadir, will starte Fe supplement  Federico Flake, MD 1:02 PM

## 2015-07-17 NOTE — Anesthesia Postprocedure Evaluation (Signed)
  Anesthesia Post-op Note  Patient: Janet [name]  Procedure(s) Performed: * No procedures listed *  Patient Location: PACU and Mother/Baby  Anesthesia Type:Epidural  Level of Consciousness: awake, alert  and oriented  Airway and Oxygen Therapy: Patient Spontanous Breathing  Post-op Pain: mild  Post-op Assessment: Post-op Vital signs reviewed, Patient's Cardiovascular Status Stable, Respiratory Function Stable, No signs of Nausea or vomiting, Adequate PO intake, Pain level controlled, No headache, No backache and Patient able to bend at knees              Post-op Vital Signs: Reviewed and stable  Last Vitals:  Filed Vitals:   07/17/15 0819  BP: 99/54  Pulse: 54  Temp: 36.6 C  Resp:     Complications: No apparent anesthesia complications

## 2015-07-17 NOTE — Progress Notes (Signed)
Admission teaching completed with [facility] [phone] in "[place]"

## 2015-07-17 NOTE — Discharge Summary (Signed)
Obstetric Discharge Summary Reason for Admission: onset of labor Prenatal Procedures: ultrasound Intrapartum Procedures: spontaneous vaginal delivery Postpartum Procedures: antibiotics Complications-Operative and Postpartum: endometritis:  treated with Zosyn.  Last fever 100.4 8/4 0130 (30 hours) HEMOGLOBIN  Date Value Ref Range Status  07/17/2015 8.2* 12.0 - 15.0 g/dL Final  09/81/1914 78.2 g/dL Final   HCT  Date Value Ref Range Status  07/17/2015 26.7* 36.0 - 46.0 % Final  04/22/2014 33 % Final    Physical Exam:  General: alert, cooperative and no distress Lochia: appropriate Uterine Fundus: firm.  Uterus nontender Chest:  Respiratory effort normal DVT Evaluation: No evidence of DVT seen on physical exam.  Discharge Diagnoses: Term Pregnancy-delivered Postpartum endometritis, resolved  Discharge Information: Date: 07/18/2015 Activity: pelvic rest Diet: routine Medications: PNV and Ibuprofen, continue FeS04 BID Condition: stable Instructions: refer to practice specific booklet Discharge to: home Follow-up Information    Follow up with [facility] [place]. Schedule an appointment as soon as possible for a visit in 6 weeks.   Contact information:   [address] [place] [address] [phone]      Newborn Data: Live born female  Birth Weight: 8 lb 10.1 oz (3915 g) APGAR: 8, 9  Home with mother.  JanetJanet [name] [name] 07/18/2015, 7:35 AM

## 2015-07-18 MED ORDER — IBUPROFEN 600 MG PO TABS: 600.0000 mg | ORAL_TABLET | Freq: Four times a day (QID) | ORAL | Status: DC

## 2015-07-18 NOTE — Progress Notes (Signed)
Discharge teaching completed with pacific interpreter [phone] "[place]". Tdap given also with interpreter. Parents requesting nexplanon. Md Earlene Plater notified and we are unable to do this in the hospital due to patient's age. She was advised to make this known to her 4-6 week clinic follow up appointment. Instructed to let them know when they made the appointment.

## 2016-03-13 ENCOUNTER — Encounter (HOSPITAL_COMMUNITY): Payer: Self-pay | Admitting: *Deleted

## 2016-03-13 ENCOUNTER — Emergency Department (HOSPITAL_COMMUNITY): Payer: Medicaid Other

## 2016-03-13 ENCOUNTER — Emergency Department (HOSPITAL_COMMUNITY)
Admission: EM | Admit: 2016-03-13 | Discharge: 2016-03-13 | Disposition: A | Discharge status: Home / Self Care | Payer: Medicaid Other | Attending: Emergency Medicine | Admitting: Emergency Medicine

## 2016-03-13 DIAGNOSIS — Z862 Personal history of diseases of the blood and blood-forming organs and certain disorders involving the immune mechanism: Secondary | ICD-10-CM | POA: Insufficient documentation

## 2016-03-13 DIAGNOSIS — S80212A Abrasion, left knee, initial encounter: Secondary | ICD-10-CM | POA: Diagnosis not present

## 2016-03-13 DIAGNOSIS — Y998 Other external cause status: Secondary | ICD-10-CM | POA: Diagnosis not present

## 2016-03-13 DIAGNOSIS — M25561 Pain in right knee: Secondary | ICD-10-CM

## 2016-03-13 DIAGNOSIS — S80211A Abrasion, right knee, initial encounter: Secondary | ICD-10-CM | POA: Insufficient documentation

## 2016-03-13 DIAGNOSIS — Y9289 Other specified places as the place of occurrence of the external cause: Secondary | ICD-10-CM | POA: Diagnosis not present

## 2016-03-13 DIAGNOSIS — M25562 Pain in left knee: Secondary | ICD-10-CM

## 2016-03-13 DIAGNOSIS — Y9389 Activity, other specified: Secondary | ICD-10-CM | POA: Insufficient documentation

## 2016-03-13 DIAGNOSIS — S8991XA Unspecified injury of right lower leg, initial encounter: Secondary | ICD-10-CM | POA: Diagnosis present

## 2016-03-13 MED ORDER — IBUPROFEN 400 MG PO TABS
600.0000 mg | ORAL_TABLET | Freq: Once | ORAL | Status: AC
  Administered 2016-03-13: 600 mg via ORAL
  Filled 2016-03-13: qty 1

## 2016-03-13 MED ORDER — IBUPROFEN 600 MG PO TABS: 600.0000 mg | ORAL_TABLET | Freq: Three times a day (TID) | ORAL | Status: DC | PRN

## 2016-03-13 NOTE — ED Notes (Signed)
Patient transported to X-ray 
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
## 2016-03-13 NOTE — ED Notes (Signed)
Patient states she only speaks [place] will call Intrep when MD ready to see patient
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
## 2016-03-13 NOTE — ED Notes (Signed)
The pt speaks  Little english she speaks [place]

## 2016-12-24 ENCOUNTER — Emergency Department (HOSPITAL_COMMUNITY)
Admission: EM | Admit: 2016-12-24 | Discharge: 2016-12-24 | Disposition: A | Discharge status: Home / Self Care | Payer: Medicaid Other | Attending: Emergency Medicine | Admitting: Emergency Medicine

## 2016-12-24 ENCOUNTER — Encounter (HOSPITAL_COMMUNITY): Payer: Self-pay | Admitting: Emergency Medicine

## 2016-12-24 DIAGNOSIS — R109 Unspecified abdominal pain: Secondary | ICD-10-CM

## 2016-12-24 DIAGNOSIS — R1013 Epigastric pain: Secondary | ICD-10-CM | POA: Insufficient documentation

## 2016-12-24 DIAGNOSIS — Z79899 Other long term (current) drug therapy: Secondary | ICD-10-CM | POA: Insufficient documentation

## 2016-12-24 LAB — COMPREHENSIVE METABOLIC PANEL
ALBUMIN: 4.4 g/dL (ref 3.5–5.0)
ALT: 24 U/L (ref 14–54)
AST: 25 U/L (ref 15–41)
Alkaline Phosphatase: 83 U/L (ref 38–126)
Anion gap: 9 (ref 5–15)
BUN: 8 mg/dL (ref 6–20)
CHLORIDE: 107 mmol/L (ref 101–111)
CO2: 24 mmol/L (ref 22–32)
Calcium: 9.6 mg/dL (ref 8.9–10.3)
Creatinine, Ser: 0.68 mg/dL (ref 0.44–1.00)
GFR calc Af Amer: >60 mL/min (ref >60)
GFR calc non Af Amer: >60 mL/min (ref >60)
GLUCOSE: 109 mg/dL — AB (ref 65–99)
Potassium: 3.7 mmol/L (ref 3.5–5.1)
Sodium: 140 mmol/L (ref 135–145)
Total Bilirubin: 0.7 mg/dL (ref 0.3–1.2)
Total Protein: 8 g/dL (ref 6.5–8.1)

## 2016-12-24 LAB — CBC
HEMATOCRIT: 41 % (ref 36.0–46.0)
Hemoglobin: 13.9 g/dL (ref 12.0–15.0)
MCH: 29.6 pg (ref 26.0–34.0)
MCHC: 33.9 g/dL (ref 30.0–36.0)
MCV: 87.2 fL (ref 78.0–100.0)
Platelets: 276 10*3/uL (ref 150–400)
RBC: 4.7 MIL/uL (ref 3.87–5.11)
RDW: 13.1 % (ref 11.5–15.5)
WBC: 7.6 10*3/uL (ref 4.0–10.5)

## 2016-12-24 LAB — LIPASE, BLOOD: Lipase: 21 U/L (ref 11–51)

## 2016-12-24 LAB — I-STAT BETA HCG BLOOD, ED (MC, WL, AP ONLY): I-stat hCG, quantitative: <5 m[IU]/mL (ref <5)

## 2016-12-24 NOTE — ED Triage Notes (Signed)
Pt brought in by EMS for c/o abd pain   [facility] [place] [facility]  Triaged by using video interrupter  Pt states she had a regular period in October and has not had one since  Pt states she has an uncomfortable feeling in her abdomen when she sits or stands  Pt denies N/V, vaginal discharge or spotting  Pt states she also has a burning sensation in her abdomen

## 2016-12-24 NOTE — Discharge Instructions (Signed)
Return to the emergency department if you develop high fever, bloody stool, worsening pain, or other new and concerning symptoms.

## 2016-12-24 NOTE — ED Provider Notes (Signed)
[facility]-EMER[facility]ENCY DEPT Provider Note   CSN: [id number] Arrival date & time: 12/24/16  0242     History   Chief Complaint Chief Complaint  Patient presents with  . Abdominal Pain    HPI Janet [name] is a 21 y.o. female.  Patient is a 21 year old female with no significant past medical history. She presents for evaluation of epigastric discomfort. This started 2 days ago and is worsening. She denies any fevers or chills. She denies any nausea or vomiting. She reports that she has not had a period in the last 2 months, however this is not completely abnormal for her. She doubts that she is pregnant, and denies any vaginal discharge or urinary complaints. There is no aggravation of her symptoms with food. There are no alleviating factors.  Patient history taken with the use of the translator tablet as she does not speak [place], only [place].   The history is provided by the patient.  Abdominal Pain   This is a new problem. The current episode started 2 days ago. The problem occurs constantly. The problem has been gradually worsening. The pain is moderate. Pertinent negatives include fever and dysuria. Nothing aggravates the symptoms. Nothing relieves the symptoms.    Past Medical History:  Diagnosis Date  . Anemia     Patient Active Problem List   Diagnosis Date Noted  . Pregnancy 07/16/2015  . [redacted] weeks gestation of pregnancy   . Encounter for fetal anatomic survey     History reviewed. No pertinent surgical history.  OB History    Gravida Para Term Preterm AB Living   2 2 2  0 0 2   SAB TAB Ectopic Multiple Live Births   0 0 0 0 2       Home Medications    Prior to Admission medications   Medication Sig Start Date End Date Taking? Authorizing Provider  ibuprofen (ADVIL,MOTRIN) 600 MG tablet Take 1 tablet (600 mg total) by mouth every 8 (eight) hours as needed. 03/13/16   Azalia BilisKevin Campos, MD    Family History History reviewed. No pertinent family history.  Social  History Social History  Substance Use Topics  . Smoking status: Never Smoker  . Smokeless tobacco: Never Used  . Alcohol use No     Allergies   Patient has no known allergies.   Review of Systems Review of Systems  Constitutional: Negative for fever.  Gastrointestinal: Positive for abdominal pain.  Genitourinary: Negative for dysuria.  All other systems reviewed and are negative.    Physical Exam Updated Vital Signs BP 120/94 (BP Location: Left Arm)   Pulse 106   Temp 97.6 F (36.4 C) (Oral)   Resp 20   SpO2 100%   Physical Exam  Constitutional: She is oriented to person, place, and time. She appears well-developed and well-nourished. No distress.  HENT:  Head: Normocephalic and atraumatic.  Neck: Normal range of motion. Neck supple.  Cardiovascular: Normal rate and regular rhythm.  Exam reveals no gallop and no friction rub.   No murmur heard. Pulmonary/Chest: Effort normal and breath sounds normal. No respiratory distress. She has no wheezes.  Abdominal: Soft. Bowel sounds are normal. She exhibits distension. There is no tenderness. There is no rebound and no guarding.  There is mild tenderness in the epigastric region. There is no rebound or guarding.  Musculoskeletal: Normal range of motion.  Neurological: She is alert and oriented to person, place, and time.  Skin: Skin is warm and dry. She is not diaphoretic.  Nursing note and vitals reviewed.    ED Treatments / Results  Labs (all labs ordered are listed, but only abnormal results are displayed) Labs Reviewed  COMPREHENSIVE METABOLIC PANEL - Abnormal; Notable for the following:       Result Value   Glucose, Bld 109 (*)    All other components within normal limits  LIPASE, BLOOD  CBC  URINALYSIS, ROUTINE W REFLEX MICROSCOPIC  I-STAT BETA HCG BLOOD, ED ([facility], [facility], [facility] ONLY)    EKG  EKG Interpretation None       Radiology No results found.  Procedures Procedures (including critical care  time)  Medications Ordered in ED Medications - No data to display   Initial Impression / Assessment and Plan / ED Course  I have reviewed the triage vital signs and the nursing notes.  Pertinent labs & imaging results that were available during my care of the patient were reviewed by me and considered in my medical decision making (see chart for details).  Clinical Course     Laboratory studies are reassuring and her physical examination reveals a nonacute abdomen. My plan was to obtain an ultrasound to evaluate her gallbladder, however the patient informed me that she has to return home to take her father to work. She will be discharged at her request. She understands to return if her pain worsens, she develops high fevers, bloody stools, or other new and concerning symptoms.  Final Clinical Impressions(s) / ED Diagnoses   Final diagnoses:  None    New Prescriptions New Prescriptions   No medications on file     Geoffery Lyons, MD 12/24/16 [phone]

## 2017-03-25 ENCOUNTER — Emergency Department (HOSPITAL_COMMUNITY): Payer: Medicaid Other

## 2017-03-25 ENCOUNTER — Emergency Department (HOSPITAL_COMMUNITY)
Admission: EM | Admit: 2017-03-25 | Discharge: 2017-03-25 | Disposition: A | Discharge status: Home / Self Care | Payer: Medicaid Other | Attending: Physician Assistant | Admitting: Physician Assistant

## 2017-03-25 ENCOUNTER — Encounter (HOSPITAL_COMMUNITY): Payer: Self-pay | Admitting: *Deleted

## 2017-03-25 DIAGNOSIS — R0789 Other chest pain: Secondary | ICD-10-CM | POA: Insufficient documentation

## 2017-03-25 LAB — CBC
HEMATOCRIT: 41.9 % (ref 36.0–46.0)
Hemoglobin: 14.1 g/dL (ref 12.0–15.0)
MCH: 30.3 pg (ref 26.0–34.0)
MCHC: 33.7 g/dL (ref 30.0–36.0)
MCV: 90.1 fL (ref 78.0–100.0)
Platelets: 290 10*3/uL (ref 150–400)
RBC: 4.65 MIL/uL (ref 3.87–5.11)
RDW: 13.4 % (ref 11.5–15.5)
WBC: 9.1 10*3/uL (ref 4.0–10.5)

## 2017-03-25 LAB — BASIC METABOLIC PANEL
Anion gap: 9 (ref 5–15)
BUN: 6 mg/dL (ref 6–20)
CHLORIDE: 105 mmol/L (ref 101–111)
CO2: 24 mmol/L (ref 22–32)
Calcium: 9.6 mg/dL (ref 8.9–10.3)
Creatinine, Ser: 0.65 mg/dL (ref 0.44–1.00)
GFR calc Af Amer: >60 mL/min (ref >60)
GFR calc non Af Amer: >60 mL/min (ref >60)
Glucose, Bld: 100 mg/dL — ABNORMAL HIGH (ref 65–99)
POTASSIUM: 4 mmol/L (ref 3.5–5.1)
SODIUM: 138 mmol/L (ref 135–145)

## 2017-03-25 LAB — I-STAT TROPONIN, ED: Troponin i, poc: 0 ng/mL (ref 0.00–0.08)

## 2017-03-25 LAB — I-STAT BETA HCG BLOOD, ED (MC, WL, AP ONLY): I-stat hCG, quantitative: <5 m[IU]/mL (ref <5)

## 2017-03-25 MED ORDER — IBUPROFEN 400 MG PO TABS
600.0000 mg | ORAL_TABLET | Freq: Once | ORAL | Status: AC
  Administered 2017-03-25: 600 mg via ORAL
  Filled 2017-03-25: qty 1
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
## 2017-03-25 NOTE — ED Triage Notes (Addendum)
Pt states R sided chest pain x 3 days.  Pain does not radiate.  Denies sob and nausea.  Denies cough, sob or recent travel.  Pt also c/o very irregular menstrual cycles - on 2 days, off 2 days, since February.

## 2017-08-22 ENCOUNTER — Encounter (HOSPITAL_COMMUNITY): Payer: Self-pay | Admitting: Emergency Medicine

## 2017-08-22 ENCOUNTER — Emergency Department (HOSPITAL_COMMUNITY)
Admission: EM | Admit: 2017-08-22 | Discharge: 2017-08-22 | Disposition: A | Discharge status: Home / Self Care | Payer: Medicaid Other | Attending: Emergency Medicine | Admitting: Emergency Medicine

## 2017-08-22 DIAGNOSIS — W208XXA Other cause of strike by thrown, projected or falling object, initial encounter: Secondary | ICD-10-CM | POA: Insufficient documentation

## 2017-08-22 DIAGNOSIS — Y998 Other external cause status: Secondary | ICD-10-CM | POA: Insufficient documentation

## 2017-08-22 DIAGNOSIS — Z23 Encounter for immunization: Secondary | ICD-10-CM | POA: Insufficient documentation

## 2017-08-22 DIAGNOSIS — Y93G3 Activity, cooking and baking: Secondary | ICD-10-CM | POA: Insufficient documentation

## 2017-08-22 DIAGNOSIS — Y929 Unspecified place or not applicable: Secondary | ICD-10-CM | POA: Insufficient documentation

## 2017-08-22 DIAGNOSIS — S81811A Laceration without foreign body, right lower leg, initial encounter: Secondary | ICD-10-CM | POA: Diagnosis not present

## 2017-08-22 MED ORDER — CEPHALEXIN 250 MG PO CAPS
500.0000 mg | ORAL_CAPSULE | Freq: Once | ORAL | Status: AC
  Administered 2017-08-22: 500 mg via ORAL
  Filled 2017-08-22: qty 2

## 2017-08-22 MED ORDER — TETANUS-DIPHTH-ACELL PERTUSSIS 5-2.5-18.5 LF-MCG/0.5 IM SUSP
0.5000 mL | Freq: Once | INTRAMUSCULAR | Status: AC
  Administered 2017-08-22: 0.5 mL via INTRAMUSCULAR
  Filled 2017-08-22: qty 0.5

## 2017-08-22 MED ORDER — CEPHALEXIN 500 MG PO CAPS: 500.0000 mg | ORAL_CAPSULE | Freq: Four times a day (QID) | ORAL | 0 refills | Status: DC

## 2017-08-22 NOTE — ED Provider Notes (Signed)
[facility]-EMERGENCY DEPT Provider Note   CSN: [id number] Arrival date & time: 08/22/17  1701     History   Chief Complaint Chief Complaint  Patient presents with  . Laceration    HPI Janet [name] is a 21 y.o. female.  The history is provided by the patient and medical records.  Laceration      21 year old female with history of anemia, presenting to the ED with laceration of right leg that occurred 3 days ago. States she was cooking and dropped a utensil and cut the medial aspect of her right leg near the knee.  States she has been cleaning area with warm water at home. States yesterday she started noticing some redness surrounding the wound and decided she should get it checked. She denies any significant pain of the area. No drainage but has had some crusting around the wound. Date of last tetanus unknown. No fever or chills.  Past Medical History:  Diagnosis Date  . Anemia     Patient Active Problem List   Diagnosis Date Noted  . Pregnancy 07/16/2015  . [redacted] weeks gestation of pregnancy   . Encounter for fetal anatomic survey     History reviewed. No pertinent surgical history.  OB History    Gravida Para Term Preterm AB Living   2 2 2  0 0 2   SAB TAB Ectopic Multiple Live Births   0 0 0 0 2       Home Medications    Prior to Admission medications   Medication Sig Start Date End Date Taking? Authorizing Provider  ibuprofen (ADVIL,MOTRIN) 600 MG tablet Take 1 tablet (600 mg total) by mouth every 8 (eight) hours as needed. Patient not taking: Reported on 03/25/2017 03/13/16   Azalia Bilisampos, Kevin, MD    Family History History reviewed. No pertinent family history.  Social History Social History  Substance Use Topics  . Smoking status: Never Smoker  . Smokeless tobacco: Never Used  . Alcohol use No     Allergies   Patient has no known allergies.   Review of Systems Review of Systems  Skin: Positive for wound.  All other systems reviewed and are  negative.    Physical Exam Updated Vital Signs BP 120/82 (BP Location: Right Arm)   Pulse 76   Temp 98.3 F (36.8 C) (Oral)   Resp 18   SpO2 100%   Physical Exam  Constitutional: She is oriented to person, place, and time. She appears well-developed and well-nourished.  HENT:  Head: Normocephalic and atraumatic.  Mouth/Throat: Oropharynx is clear and moist.  Eyes: Pupils are equal, round, and reactive to light. Conjunctivae and EOM are normal.  Neck: Normal range of motion.  Cardiovascular: Normal rate, regular rhythm and normal heart sounds.   Pulmonary/Chest: Effort normal and breath sounds normal. No respiratory distress. She has no wheezes.  Musculoskeletal: Normal range of motion.  2cm laceration of medial right leg just proximal to the knee; wound is scabbed over with some crusting within; there is some surrounding erythema along edges of the wound; no induration, no fluctuance or abscess formation, no tissue crepitus  Neurological: She is alert and oriented to person, place, and time.  Skin: Skin is warm and dry.  Psychiatric: She has a normal mood and affect.  Nursing note and vitals reviewed.    ED Treatments / Results  Labs (all labs ordered are listed, but only abnormal results are displayed) Labs Reviewed - No data to display  EKG  EKG Interpretation  None       Radiology No results found.  Procedures Procedures (including critical care time)  Medications Ordered in ED Medications - No data to display   Initial Impression / Assessment and Plan / ED Course  I have reviewed the triage vital signs and the nursing notes.  Pertinent labs & imaging results that were available during my care of the patient were reviewed by me and considered in my medical decision making (see chart for details).  21 year old female here with laceration of the right medial leg just proximal to the knee. Reports she was cooking and dropped something. This occurred 3 days  ago. Wound is scabbed over with some mild erythema around the edges. There is no sign of abscess formation at this time. No tissue crepitus. Date of last tetanus unknown so will be updated here. Given wound is several days old at this point, will leave open to close by secondary intention. Given the new erythema, will start on abx.  Discussed home wound care with soap and warm water. Can follow-up with PCP if any ongoing issues.  Discussed plan with patient, she acknowledged understanding and agreed with plan of care.  Return precautions given for new or worsening symptoms.  Final Clinical Impressions(s) / ED Diagnoses   Final diagnoses:  Laceration of right lower extremity, initial encounter    New Prescriptions New Prescriptions   CEPHALEXIN (KEFLEX) 500 MG CAPSULE    Take 1 capsule (500 mg total) by mouth 4 (four) times daily.     Garlon Hatchet, PA-C 08/22/17 2016    Raeford Razor, MD 09/02/17 [phone]

## 2017-08-22 NOTE — ED Notes (Signed)
Pt wanted wound wrapped. Informed the PA. Non-adherent placed over wound and it was wrapped with stretch gauze.

## 2017-08-22 NOTE — Discharge Instructions (Signed)
Keep wound clean with soap and warm water. Take antibiotics as directed. Follow-up with your doctor if any ongoing issues. Return here for any new concerns.

## 2017-08-22 NOTE — ED Triage Notes (Signed)
Pt sts cut her right leg 3 days ago; pt sts some pain; some drainage noted

## 2017-08-22 NOTE — ED Notes (Signed)
PT states understanding of care given, follow up care, and medication prescribed. PT ambulated from ED to car with a steady gait. 

## 2017-08-22 NOTE — ED Notes (Signed)
Patient is A&Ox4.  No signs of distress noted.  Please see providers complete history and physical exam.  

## 2017-11-20 ENCOUNTER — Other Ambulatory Visit: Payer: Self-pay

## 2017-11-20 ENCOUNTER — Encounter (HOSPITAL_COMMUNITY): Payer: Self-pay

## 2017-11-20 ENCOUNTER — Emergency Department (HOSPITAL_COMMUNITY)
Admission: EM | Admit: 2017-11-20 | Discharge: 2017-11-22 | Disposition: A | Discharge status: Other Acute Inpatient Hospital | Payer: Medicaid Other | Attending: Emergency Medicine | Admitting: Emergency Medicine

## 2017-11-20 DIAGNOSIS — X789XXA Intentional self-harm by unspecified sharp object, initial encounter: Secondary | ICD-10-CM | POA: Insufficient documentation

## 2017-11-20 DIAGNOSIS — Y999 Unspecified external cause status: Secondary | ICD-10-CM | POA: Diagnosis not present

## 2017-11-20 DIAGNOSIS — S0031XA Abrasion of nose, initial encounter: Secondary | ICD-10-CM | POA: Insufficient documentation

## 2017-11-20 DIAGNOSIS — S0081XA Abrasion of other part of head, initial encounter: Secondary | ICD-10-CM | POA: Diagnosis not present

## 2017-11-20 DIAGNOSIS — R45851 Suicidal ideations: Secondary | ICD-10-CM | POA: Insufficient documentation

## 2017-11-20 DIAGNOSIS — F329 Major depressive disorder, single episode, unspecified: Secondary | ICD-10-CM | POA: Insufficient documentation

## 2017-11-20 DIAGNOSIS — S50812A Abrasion of left forearm, initial encounter: Secondary | ICD-10-CM | POA: Diagnosis not present

## 2017-11-20 DIAGNOSIS — Y9389 Activity, other specified: Secondary | ICD-10-CM | POA: Diagnosis not present

## 2017-11-20 DIAGNOSIS — S59912A Unspecified injury of left forearm, initial encounter: Secondary | ICD-10-CM | POA: Diagnosis present

## 2017-11-20 DIAGNOSIS — Z23 Encounter for immunization: Secondary | ICD-10-CM | POA: Diagnosis not present

## 2017-11-20 DIAGNOSIS — T1491XA Suicide attempt, initial encounter: Secondary | ICD-10-CM

## 2017-11-20 DIAGNOSIS — Y929 Unspecified place or not applicable: Secondary | ICD-10-CM | POA: Insufficient documentation

## 2017-11-20 DIAGNOSIS — Z046 Encounter for general psychiatric examination, requested by authority: Secondary | ICD-10-CM | POA: Diagnosis not present

## 2017-11-20 LAB — CBC
HCT: 43.1 % (ref 36.0–46.0)
HEMOGLOBIN: 14.6 g/dL (ref 12.0–15.0)
MCH: 31.2 pg (ref 26.0–34.0)
MCHC: 33.9 g/dL (ref 30.0–36.0)
MCV: 92.1 fL (ref 78.0–100.0)
Platelets: 270 10*3/uL (ref 150–400)
RBC: 4.68 MIL/uL (ref 3.87–5.11)
RDW: 13.8 % (ref 11.5–15.5)
WBC: 5.7 10*3/uL (ref 4.0–10.5)

## 2017-11-20 LAB — RAPID URINE DRUG SCREEN, HOSP PERFORMED
Amphetamines: NOT DETECTED
BARBITURATES: NOT DETECTED
BENZODIAZEPINES: NOT DETECTED
COCAINE: NOT DETECTED
OPIATES: NOT DETECTED
TETRAHYDROCANNABINOL: NOT DETECTED

## 2017-11-20 LAB — COMPREHENSIVE METABOLIC PANEL
ALT: 21 U/L (ref 14–54)
AST: 25 U/L (ref 15–41)
Albumin: 4.3 g/dL (ref 3.5–5.0)
Alkaline Phosphatase: 73 U/L (ref 38–126)
Anion gap: 10 (ref 5–15)
BUN: 5 mg/dL — ABNORMAL LOW (ref 6–20)
CHLORIDE: 108 mmol/L (ref 101–111)
CO2: 21 mmol/L — AB (ref 22–32)
CREATININE: 0.54 mg/dL (ref 0.44–1.00)
Calcium: 9.1 mg/dL (ref 8.9–10.3)
GFR calc non Af Amer: >60 mL/min (ref >60)
Glucose, Bld: 127 mg/dL — ABNORMAL HIGH (ref 65–99)
POTASSIUM: 3.3 mmol/L — AB (ref 3.5–5.1)
SODIUM: 139 mmol/L (ref 135–145)
Total Bilirubin: 0.3 mg/dL (ref 0.3–1.2)
Total Protein: 7.8 g/dL (ref 6.5–8.1)

## 2017-11-20 LAB — SALICYLATE LEVEL

## 2017-11-20 LAB — I-STAT BETA HCG BLOOD, ED (MC, WL, AP ONLY): I-stat hCG, quantitative: <5 m[IU]/mL (ref <5)

## 2017-11-20 LAB — HCG, QUANTITATIVE, PREGNANCY: hCG, Beta Chain, Quant, S: <1 m[IU]/mL (ref <5)

## 2017-11-20 LAB — ACETAMINOPHEN LEVEL

## 2017-11-20 LAB — ETHANOL: Alcohol, Ethyl (B): 139 mg/dL — ABNORMAL HIGH (ref <10)

## 2017-11-20 MED ORDER — TETANUS-DIPHTH-ACELL PERTUSSIS 5-2.5-18.5 LF-MCG/0.5 IM SUSP
0.5000 mL | Freq: Once | INTRAMUSCULAR | Status: AC
  Administered 2017-11-20: 0.5 mL via INTRAMUSCULAR
  Filled 2017-11-20: qty 0.5

## 2017-11-20 NOTE — ED Notes (Addendum)
Pt's cell phone placed in valuables envelope and returned to Security. Pt sitting up in bed. Declined to eat lunch and snack even after much encouragement.

## 2017-11-20 NOTE — ED Notes (Addendum)
Pt awake. Sitting up in bed. Offered breakfast and snack x 2 - declined.
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
## 2017-11-20 NOTE — ED Notes (Signed)
Lunch tray delivered to pt - pt declines to eat. Pt did not eat breakfast either. Encouraging po fluids - declines.
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
## 2017-11-20 NOTE — [facility] Counselor (Signed)
Clinician attempted to call pt's nurse. Clinician to call back.    Redmond Pullingreylese D Quintavia Rogstad, MS, [facility], [facility] Triage Specialist [phone]
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
## 2017-11-20 NOTE — ED Notes (Signed)
Pt offered cookies, ginger ale, water, etc. Pt refused all. Pt sad and tearful. Wants the lights kept on. Will continue to monitor pt.and talk with her.

## 2017-11-20 NOTE — ED Notes (Signed)
Communicated w/pt through The Krogerinterpretor - [name]. Pt noted to give lengthy answers to questions in a very quiet voice so Interpretor advised he was experiencing difficulty understanding everything pt was saying. Asked for pt to pause while speaking - pt continued.Pt asking for her cell phone multiple times - states she wants to message her boyfriend and friends. Advised pt of policy. Pt also asking to leave multiple times. States she needs to get to work and her 2 children. States her children are w/her mother. Voiced understanding that she is IVC'd and placement is being sought for her.  Pt voiced understanding of Medical Clearance Pt Policy - copy given to pt. Prior to ending conversation via Engineer, technical salesnterpretor, Charity fundraiserN ensured pt's questions were all answered w/pt voicing understanding. Offered for pt to shower - declined.

## 2017-11-20 NOTE — ED Notes (Signed)
Noticed that Pt did not touch her dinner tray. Asked Pt if there was anything else we could get for her but she stated No. Pt stated she has not eaten anything since she has been here but doesn't want anything. Pt also let staff know she does not want her lights off even when trying to sleep.

## 2017-11-20 NOTE — ED Notes (Addendum)
Pt on phone at nurses' desk talking to her mother. Pt noted w/bandage to left wrist/arm - intact, dry.

## 2017-11-20 NOTE — ED Notes (Signed)
Spoke with pt at length with translator, about situation and why shes here. Pt crying and states that "I just want to go home". Told pt that she would be speaking with another md and they will decide what happens.

## 2017-11-20 NOTE — ED Notes (Signed)
TTS at bedside. 
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
## 2017-11-20 NOTE — ED Notes (Signed)
Pt ambulated to bathroom and back to room w/o difficulty.  

## 2017-11-20 NOTE — ED Notes (Signed)
Pt continues to ask same repetitive questions - stating she wants to go home and that she didn't know by cutting herself that she would have to stay.

## 2017-11-20 NOTE — ED Notes (Signed)
1st exam completed by Dr Fayrene FearingJames - copy placed w/each set of IVC papers, [facility], copy sent to Medical Records, original placed in folder for Magistrate, and all 3 sets on clipboard.

## 2017-11-20 NOTE — ED Notes (Signed)
Pt standing in doorway of room. NAD. Denies any needs.
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
## 2017-11-21 NOTE — BH Assessment (Signed)
[facility]HH Assessment Progress Note  [facility]. Pt's nurse Maryan PulsMcKeown, Audrey R, RN has been notified. Attending provider will be Dr. Jama Flavorsobos, MD. Call to report [phone].  Janet [name], MSW, LCSW Therapeutic Triage Specialist  [phone]

## 2017-11-21 NOTE — ED Triage Notes (Signed)
PT refused breakfast

## 2017-11-21 NOTE — ED Triage Notes (Signed)
Pt refused supper.

## 2017-11-21 NOTE — ED Triage Notes (Signed)
PT refused lunch. 

## 2017-11-21 NOTE — ED Notes (Signed)
Pt to nurses station making phone call
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
## 2017-11-21 NOTE — ED Triage Notes (Signed)
Pt to staff desk to make call.

## 2017-11-21 NOTE — ED Triage Notes (Signed)
Pt standing in door way with blanket around shoulders.

## 2017-11-21 NOTE — ED Triage Notes (Signed)
[facility]  [facility]  The interpreter service used during  the TTS session

## 2017-11-21 NOTE — ED Triage Notes (Signed)
Pt at staff desk asking when she was going home. With the use of the interpreter ,  Pt updated on plan of care.

## 2017-11-22 ENCOUNTER — Other Ambulatory Visit: Payer: Self-pay

## 2017-11-22 ENCOUNTER — Encounter (HOSPITAL_COMMUNITY): Payer: Self-pay

## 2017-11-22 ENCOUNTER — Inpatient Hospital Stay (HOSPITAL_COMMUNITY)
Admission: AD | Admit: 2017-11-22 | Discharge: 2017-11-24 | DRG: 885 | Disposition: A | Discharge status: Home / Self Care | Payer: Medicaid Other | Source: Intra-hospital | Attending: Psychiatry | Admitting: Psychiatry

## 2017-11-22 DIAGNOSIS — F322 Major depressive disorder, single episode, severe without psychotic features: Principal | ICD-10-CM | POA: Diagnosis present

## 2017-11-22 DIAGNOSIS — S61512A Laceration without foreign body of left wrist, initial encounter: Secondary | ICD-10-CM | POA: Diagnosis not present

## 2017-11-22 DIAGNOSIS — Z915 Personal history of self-harm: Secondary | ICD-10-CM

## 2017-11-22 DIAGNOSIS — Z639 Problem related to primary support group, unspecified: Secondary | ICD-10-CM

## 2017-11-22 DIAGNOSIS — T1491XA Suicide attempt, initial encounter: Secondary | ICD-10-CM

## 2017-11-22 DIAGNOSIS — F332 Major depressive disorder, recurrent severe without psychotic features: Secondary | ICD-10-CM | POA: Diagnosis not present

## 2017-11-22 DIAGNOSIS — Z23 Encounter for immunization: Secondary | ICD-10-CM | POA: Diagnosis not present

## 2017-11-22 DIAGNOSIS — R45851 Suicidal ideations: Secondary | ICD-10-CM | POA: Diagnosis present

## 2017-11-22 DIAGNOSIS — X789XXA Intentional self-harm by unspecified sharp object, initial encounter: Secondary | ICD-10-CM | POA: Diagnosis not present

## 2017-11-22 DIAGNOSIS — S61519A Laceration without foreign body of unspecified wrist, initial encounter: Secondary | ICD-10-CM

## 2017-11-22 MED ORDER — ADULT MULTIVITAMIN W/MINERALS CH
1.0000 | ORAL_TABLET | Freq: Every day | ORAL | Status: DC
Start: 2017-11-22 — End: 2017-11-24
  Administered 2017-11-23 – 2017-11-24 (×2): 1 via ORAL
  Filled 2017-11-22 (×6): qty 1

## 2017-11-22 MED ORDER — POTASSIUM CHLORIDE CRYS ER 20 MEQ PO TBCR
20.0000 meq | EXTENDED_RELEASE_TABLET | Freq: Two times a day (BID) | ORAL | Status: AC
  Administered 2017-11-22 – 2017-11-23 (×2): 20 meq via ORAL
  Filled 2017-11-22 (×4): qty 1

## 2017-11-22 MED ORDER — ALUM & MAG HYDROXIDE-SIMETH 200-200-20 MG/5ML PO SUSP: 30.0000 mL | ORAL | Status: DC | PRN

## 2017-11-22 MED ORDER — CHLORDIAZEPOXIDE HCL 25 MG PO CAPS: 25.0000 mg | ORAL_CAPSULE | Freq: Four times a day (QID) | ORAL | Status: DC | PRN

## 2017-11-22 MED ORDER — VITAMIN B-1 100 MG PO TABS
100.0000 mg | ORAL_TABLET | Freq: Every day | ORAL | Status: DC
Start: 2017-11-23 — End: 2017-11-24
  Administered 2017-11-23 – 2017-11-24 (×2): 100 mg via ORAL
  Filled 2017-11-22 (×5): qty 1

## 2017-11-22 MED ORDER — INFLUENZA VAC SPLIT QUAD 0.5 ML IM SUSY
0.5000 mL | PREFILLED_SYRINGE | INTRAMUSCULAR | Status: AC
  Administered 2017-11-23: 0.5 mL via INTRAMUSCULAR
  Filled 2017-11-22: qty 0.5

## 2017-11-22 MED ORDER — MAGNESIUM HYDROXIDE 400 MG/5ML PO SUSP: 30.0000 mL | Freq: Every day | ORAL | Status: DC | PRN

## 2017-11-22 MED ORDER — HYDROXYZINE HCL 25 MG PO TABS: 25.0000 mg | ORAL_TABLET | Freq: Four times a day (QID) | ORAL | Status: DC | PRN

## 2017-11-22 MED ORDER — LOPERAMIDE HCL 2 MG PO CAPS: 2.0000 mg | ORAL_CAPSULE | ORAL | Status: DC | PRN

## 2017-11-22 MED ORDER — ONDANSETRON 4 MG PO TBDP: 4.0000 mg | ORAL_TABLET | Freq: Four times a day (QID) | ORAL | Status: DC | PRN

## 2017-11-22 MED ORDER — TRAZODONE HCL 50 MG PO TABS: 50.0000 mg | ORAL_TABLET | Freq: Every evening | ORAL | Status: DC | PRN

## 2017-11-22 MED ORDER — HYDROXYZINE HCL 25 MG PO TABS: 25.0000 mg | ORAL_TABLET | Freq: Three times a day (TID) | ORAL | Status: DC | PRN

## 2017-11-22 MED ORDER — ACETAMINOPHEN 325 MG PO TABS: 650.0000 mg | ORAL_TABLET | Freq: Four times a day (QID) | ORAL | Status: DC | PRN

## 2017-11-22 NOTE — Progress Notes (Signed)
Pt observed this evening in the milieu, talking with her interpreter as she mainly speaks nepali.  Through the interpreter, pt denies SI/HI/AVH.  She denies having any withdrawal symptoms.  She voices no needs or concerns at this time.  She is anxious to be discharged to get back to her children.  The bed that the pt was assigned is broken, so the pt was moved to another room on the 300 hall for the night until the bed can be fixed or replaced.  Pt voices understanding.  Support and encouragement offered.  Discharge plans are in process.  Pt plans to return home with her family.  Safety maintained with q15 minute checks.

## 2017-11-22 NOTE — ED Notes (Signed)
Pt belonging retrieved from security and the locker ready for [employer] to transport

## 2017-11-22 NOTE — Tx Team (Signed)
Initial Treatment Plan 11/22/2017 3:33 PM Janet JacksonAsha [name] QIO:[id number]    PATIENT STRESSORS: Financial difficulties Marital or family conflict   PATIENT STRENGTHS: Wellsite geologistCommunication skills General fund of knowledge Physical Health Work skills   PATIENT IDENTIFIED PROBLEMS: "I'm sad because I want to see my children."  "I want to go home."                   DISCHARGE CRITERIA:  Need for constant or close observation no longer present  PRELIMINARY DISCHARGE PLAN: Outpatient therapy  PATIENT/FAMILY INVOLVEMENT: This treatment plan has been presented to and reviewed with the patient, Janet [name].  The patient and family have been given the opportunity to ask questions and make suggestions.  Lindajo Royalaniel P Micala Saltsman, RN 11/22/2017, 3:33 PM

## 2017-11-22 NOTE — ED Notes (Signed)
Patient asking to call her boyfriend, but doesn't know the number. RN used the interrupter machine. Patient also asking about when she is going to go home. Patient states she is only here because she wanted attention.

## 2017-11-22 NOTE — ED Notes (Signed)
Patient was given Cookies for snack, No Diet was ordered [facility].
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
## 2017-11-22 NOTE — ED Notes (Signed)
[facility] with [facility]. All pt stuff was handed to [employer]

## 2017-11-23 DIAGNOSIS — F332 Major depressive disorder, recurrent severe without psychotic features: Secondary | ICD-10-CM

## 2017-11-23 NOTE — Progress Notes (Signed)
[facility]  11/23/2017 12:55 PM Freda Jacksonsha Standage  MRN:  [id number]   Subjective:  Patient seen with interpreter and she reports that she is feeling better and dos not want any medications. Patient refuses medications and agrees to therapy. She states that she has opened up to her friends and they are very supportive. She states that she is ready to go back to work and see her children. She denies any SI/HI/AVH and contracts for safety.  Objective: Patient's chart and findings reviewed and discussed with treatment team. Patient presents pleasant and with appropriate affect. She will be going to her parents after discharge and her children will be there. CSW has been notified about arranging therapy.   Principal Problem: MDD (major depressive disorder), single episode, severe (HCC) Diagnosis:   Patient Active Problem List   Diagnosis Date Noted  . MDD (major depressive disorder), single episode, severe (HCC) [F32.2] 11/22/2017  . Pregnancy [Z34.90] 07/16/2015  . [redacted] weeks gestation of pregnancy [Z3A.27]   . Encounter for fetal anatomic survey [Z36.89]    Total Time spent with patient: 15 minutes  Past Psychiatric History: See H&P  Past Medical History:  Past Medical History:  Diagnosis Date  . Anemia    History reviewed. No pertinent surgical history. Family History: History reviewed. No pertinent family history. Family Psychiatric  History: See H&P Social History:  Social History   Substance and Sexual Activity  Alcohol Use No     Social History   Substance and Sexual Activity  Drug Use No    Social History   Socioeconomic History  . Marital status: Married    Spouse name: None  . Number of children: None  . Years of education: None  . Highest education level: None  Social Needs  . Financial resource strain: None  . Food insecurity - worry: None  . Food insecurity - inability: None  . Transportation needs - medical: None  . Transportation needs - non-medical: None   Occupational History  . None  Tobacco Use  . Smoking status: Never Smoker  . Smokeless tobacco: Never Used  Substance and Sexual Activity  . Alcohol use: No  . Drug use: No  . Sexual activity: No  Other Topics Concern  . None  Social History Narrative  . None   Additional Social History:    Pain Medications: See MAR Prescriptions: See MAR Over the Counter: See MAR History of alcohol / drug use?: Yes Name of Substance 1: Alcohol 1 - Age of First Use: Unsure 1 - Amount (size/oz): "I only have one drink when I am with friends. " BAL was 139 at ED when medically cleared 1 - Frequency: Monthly 1 - Duration: Unsure 1 - Last Use / Amount: "Maybe a little beer before I came in"                  Sleep: Good  Appetite:  Good  Current Medications: Current Facility-Administered Medications  Medication Dose Route Frequency Provider Last Rate Last Dose  . acetaminophen (TYLENOL) tablet 650 mg  650 mg Oral Q6H PRN Okonkwo, Justina A, NP      . alum & mag hydroxide-simeth (MAALOX/MYLANTA) 200-200-20 MG/5ML suspension 30 mL  30 mL Oral Q4H PRN Okonkwo, Justina A, NP      . chlordiazePOXIDE (LIBRIUM) capsule 25 mg  25 mg Oral Q6H PRN Kaenan Jake A, MD      . hydrOXYzine (ATARAX/VISTARIL) tablet 25 mg  25 mg Oral Q6H PRN Nellene Courtois,  Rockey Situ, MD      . loperamide (IMODIUM) capsule 2-4 mg  2-4 mg Oral PRN Konner Saiz, Rockey Situ, MD      . magnesium hydroxide (MILK OF MAGNESIA) suspension 30 mL  30 mL Oral Daily PRN Okonkwo, Justina A, NP      . multivitamin with minerals tablet 1 tablet  1 tablet Oral Daily Josian Lanese, Rockey Situ, MD   1 tablet at 11/23/17 [phone]  . ondansetron (ZOFRAN-ODT) disintegrating tablet 4 mg  4 mg Oral Q6H PRN Rheagan Nayak A, MD      . thiamine (VITAMIN B-1) tablet 100 mg  100 mg Oral Daily Nikolas Casher, Rockey Situ, MD   100 mg at 11/23/17 0819  . traZODone (DESYREL) tablet 50 mg  50 mg Oral QHS PRN Okonkwo, Justina A, NP        Lab Results: No results found for this or  any previous visit (from the past 48 hour(s)).  Blood Alcohol level:  Lab Results  Component Value Date   ETH 139 (H) 11/20/2017    Metabolic Disorder Labs: No results found for: HGBA1C, MPG No results found for: PROLACTIN No results found for: CHOL, TRIG, HDL, CHOLHDL, VLDL, LDLCALC  Physical Findings: AIMS: Facial and Oral Movements Muscles of Facial Expression: None, normal Lips and Perioral Area: None, normal Jaw: None, normal Tongue: None, normal,Extremity Movements Upper (arms, wrists, hands, fingers): None, normal Lower (legs, knees, ankles, toes): None, normal, Trunk Movements Neck, shoulders, hips: None, normal, Overall Severity Severity of abnormal movements (highest score from questions above): None, normal Incapacitation due to abnormal movements: None, normal Patient's awareness of abnormal movements (rate only patient's report): No Awareness, Dental Status Current problems with teeth and/or dentures?: No Does patient usually wear dentures?: No  CIWA:  CIWA-Ar Total: 0 COWS:     Musculoskeletal: Strength & Muscle Tone: within normal limits Gait & Station: normal Patient leans: N/A  Psychiatric Specialty Exam: Physical Exam  Vitals reviewed. Constitutional: She is oriented to person, place, and time. She appears well-developed and well-nourished.  Cardiovascular: Normal rate.  Respiratory: Effort normal.  Musculoskeletal: Normal range of motion.  Neurological: She is alert and oriented to person, place, and time.  Skin: Skin is warm.    Review of Systems  Constitutional: Negative.   HENT: Negative.   Eyes: Negative.   Respiratory: Negative.   Cardiovascular: Negative.   Gastrointestinal: Negative.   Genitourinary: Negative.   Musculoskeletal: Negative.   Skin: Negative.   Neurological: Negative.   Endo/Heme/Allergies: Negative.   Psychiatric/Behavioral: Negative.     Blood pressure 112/84, pulse 82, temperature 97.8 F (36.6 C), temperature  source Oral, resp. rate 16, height 5' 4.5" (1.638 m), weight 54.4 kg (120 lb), unknown if currently breastfeeding.Body mass index is 20.28 kg/m.  General Appearance: Casual  Eye Contact:  Good  Speech:  Clear and Coherent and Normal Rate  Volume:  Normal  Mood:  Euthymic  Affect:  Congruent  Thought Process:  Goal Directed and Descriptions of Associations: Intact  Orientation:  Full (Time, Place, and Person)  Thought Content:  WDL  Suicidal Thoughts:  No  Homicidal Thoughts:  No  Memory:  Immediate;   Good Recent;   Good Remote;   Good  Judgement:  Good  Insight:  Fair  Psychomotor Activity:  Normal  Concentration:  Concentration: Good and Attention Span: Good  Recall:  Good  Fund of Knowledge:  Good  Language:  Good  Akathisia:  No  Handed:  Right  AIMS (if indicated):  Assets:  Communication Skills Desire for Improvement Financial Resources/Insurance Housing Physical Health Social Support  ADL's:  Intact  Cognition:  WNL  Sleep:  Number of Hours: 5.75   Problems Addressed: MDD severe  Treatment Plan Summary: Daily contact with patient to assess and evaluate symptoms and progress in treatment and Plan is to:  -Encourage group therapy participation -Educate on medication use -CSW to arrange therapy appointment -Possible discharge tomorrow  Maryfrances Bunnellravis B Money, FNP 11/23/2017, 12:55 PM   Agree with NP Progress Note

## 2017-11-23 NOTE — Progress Notes (Signed)
Nursing Progress Note 1900-0730  D) Patient presents with bright affect and pleasant mood. Patient is observed up in the milieu and attended group. Patient is programming on 400 hall per MD order. Patient is free of withdrawal symptoms at this time. Via interpreter, patient denies questions or concerns this evening. Patient denies need for sleep medications. Patient denies SI/HI/AVH or pain. Patient contracts for safety on the unit. Patient reports she is "hopeful" for discharge tomorrow. Patient observed interacting with peers and utilizing the telephone.  A) Emotional support given. 1:1 interaction and active listening provided. Snacks and fluids provided. Opportunities for questions or concerns presented to patient. Patient encouraged to continue to work on treatment goals. Labs, vital signs and patient behavior monitored throughout shift. Patient safety maintained with q15 min safety checks. Low fall risk precautions in place.  R) Patient receptive to interaction with nurse. Patient remains safe on the unit at this time. Patient is resting in bed without complaints. Will continue to monitor.

## 2017-11-23 NOTE — Plan of Care (Signed)
Safety Care Plan Documentation  Goal: Periods of time without injury will increase Intervention: Patient is on q15 minute safety checks and low fall risk precautions. Patient verbally contracts for safety on the unit. Outcome: Patient remains safe at this time  11/23/2017 11:02 PM - Progressing by Ferrel Loganollazo, Mujtaba Bollig A, RN

## 2017-11-23 NOTE — Progress Notes (Signed)
Recreation Therapy Notes  Date: 11/23/17 Time: 0930 Location: 300 Hall Dayroom  Group Topic: Stress Management  Goal Area(s) Addresses:  Patient will verbalize importance of using healthy stress management.  Patient will identify positive emotions associated with healthy stress management.   Intervention: Stress Management  Activity :  Guided Imagery.  LRT introduced the stress management technique of guided imagery.  LRT read a script that allowed patients to envision their peaceful place.  Patients were to follow along as script was read.  Education:  Stress Management, Discharge Planning.   Education Outcome: Acknowledges edcuation/In group clarification offered/Needs additional education  Clinical Observations/Feedback: Pt did not attend group.    Caroll RancherMarjette Regnia Mathwig, LRT/CTRS         Caroll RancherLindsay, Ruthanne Mcneish A 11/23/2017 12:21 PM

## 2017-11-23 NOTE — Progress Notes (Addendum)
Patient ID: Janet [name], female   DOB: 11/15/1996, 21 y.o.   MRN: [id number]  Writer utilized interpreter in order to communicate with patient:   DAR: Pt. Denies SI/HI and A/V Hallucinations. She reports that her sleep last night was good, her appetite is good, energy level is normal, and her concentration is good. She rates her depression, hopelessness, and anxiety levels at 0/10. She does not report any pain or discomfort at this time. Patient denies any withdrawal symptoms this time. She received her Influenza vaccine and tolerated well. Support and encouragement provided to the patient. Scheduled medications administered to patient per physician's orders. Patient is cooperative but minimal which may be due to language barrier. She is seen in the milieu and is able to express her needs through interpreter. Q15 minute checks are maintained for safety.
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
## 2017-11-24 DIAGNOSIS — X789XXA Intentional self-harm by unspecified sharp object, initial encounter: Secondary | ICD-10-CM

## 2017-11-24 DIAGNOSIS — S61519A Laceration without foreign body of unspecified wrist, initial encounter: Secondary | ICD-10-CM
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
## 2017-11-24 NOTE — [facility] Suicide Risk Assessment (Signed)
[facility] INPATIENT:  Family/Significant Other Suicide Prevention Education  Suicide Prevention Education:  Patient Refusal for Family/Significant Other Suicide Prevention Education: The patient Janet [name] has refused to provide written consent for family/significant other to be provided Family/Significant Other Suicide Prevention Education during admission and/or prior to discharge.  Physician notified.  SPE completed with pt, as pt refused to consent to family contact. SPI pamphlet provided to pt and pt was encouraged to share information with support network, ask questions, and talk about any concerns relating to SPE. Pt denies access to guns/firearms and verbalized understanding of information provided. Mobile Crisis information also provided to pt.   Saxton Chain N Smart LCSW 11/24/2017, 8:03 AM

## 2017-11-24 NOTE — Progress Notes (Signed)
Patient ID: Janet [name], female   DOB: 10/11/1996, 21 y.o.   MRN: [id number] Patient educated on her discharge instructions and verbalized understanding.  Pt left hospital with all of her discharge instructions and personal belongings.  Transportation was provided by her boyfriend who met her at the entrance of the unit.
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
## 2017-11-24 NOTE — Progress Notes (Signed)
Patient ID: Janet [name], female   DOB: 09/09/1996, 21 y.o.   MRN: [id number] Patient denies SI/HI/AVH, took scheduled meds as ordered, and denies having any concerns this morning.  Pt reports a good appetite, reports readiness for discharge, states that what is most important for her today is "get happy and go back home", and "be happy, forget bad things".  Patient reports a good amount of sleep last night, describes her energy level as "normal", reports her concentration level as good, denies being depressed, hopeless or anxious.  Q15 minute safety checks are in place, will continue to monitor.

## 2017-11-24 NOTE — Discharge Instructions (Signed)
Pt educated on her discharge instructions via language interpreter and verbalizes understanding.

## 2017-11-24 NOTE — Progress Notes (Signed)
  [facility] Discharge Plan :  Will you be returning to the same living situation after discharge:  Yes,  home At discharge, do you have transportation home?: Yes,  family member Do you have the ability to pay for your medications: Yes,  mental health  Release of information consent forms completed and submitted to medical records by CSW.  Patient to Follow up at: Follow-up Information    [facility].   Specialty:  Professional Counselor Why:  Please walk in within 3 days of hospital discharge to be assessed for therapy services. Walk in hours: Monday-Friday 8am-12pm. Thank you.  Contact information: [address] [place] [phone]         Pt also provided with [facility] pamphlet and [facility] information for additional community supports.   Next level of care provider has access to [facility] Link:no  Safety Planning and Suicide Prevention discussed: Yes,  SPE completed with pt; pt declined to consent to family contact.   Have you used any form of tobacco in the last 30 days? (Cigarettes, Smokeless Tobacco, Cigars, and/or Pipes): No  Has patient been referred to the Quitline?: N/A patient is not a smoker  Patient has been referred for addiction treatment: Yes  Pulte HomesHeather N Smart, LCSW 11/24/2017, 8:38 AM

## 2017-12-04 ENCOUNTER — Emergency Department (HOSPITAL_COMMUNITY)
Admission: EM | Admit: 2017-12-04 | Discharge: 2017-12-04 | Disposition: A | Discharge status: Home / Self Care | Payer: Medicaid Other | Attending: Emergency Medicine | Admitting: Emergency Medicine

## 2017-12-04 ENCOUNTER — Encounter (HOSPITAL_COMMUNITY): Payer: Self-pay

## 2017-12-04 DIAGNOSIS — Z046 Encounter for general psychiatric examination, requested by authority: Secondary | ICD-10-CM | POA: Diagnosis present

## 2017-12-04 DIAGNOSIS — Z1339 Encounter for screening examination for other mental health and behavioral disorders: Secondary | ICD-10-CM | POA: Insufficient documentation

## 2017-12-04 DIAGNOSIS — Z658 Other specified problems related to psychosocial circumstances: Secondary | ICD-10-CM | POA: Diagnosis not present

## 2017-12-04 LAB — ETHANOL: Alcohol, Ethyl (B): <10 mg/dL (ref <10)

## 2017-12-04 LAB — COMPREHENSIVE METABOLIC PANEL
ALT: 18 U/L (ref 14–54)
ANION GAP: 8 (ref 5–15)
AST: 23 U/L (ref 15–41)
Albumin: 4.3 g/dL (ref 3.5–5.0)
Alkaline Phosphatase: 68 U/L (ref 38–126)
BUN: 9 mg/dL (ref 6–20)
CHLORIDE: 105 mmol/L (ref 101–111)
CO2: 27 mmol/L (ref 22–32)
Calcium: 9.2 mg/dL (ref 8.9–10.3)
Creatinine, Ser: 0.59 mg/dL (ref 0.44–1.00)
GFR calc non Af Amer: >60 mL/min (ref >60)
Glucose, Bld: 107 mg/dL — ABNORMAL HIGH (ref 65–99)
POTASSIUM: 3.7 mmol/L (ref 3.5–5.1)
SODIUM: 140 mmol/L (ref 135–145)
Total Bilirubin: 0.5 mg/dL (ref 0.3–1.2)
Total Protein: 8.2 g/dL — ABNORMAL HIGH (ref 6.5–8.1)

## 2017-12-04 LAB — RAPID URINE DRUG SCREEN, HOSP PERFORMED
AMPHETAMINES: NOT DETECTED
Barbiturates: NOT DETECTED
Benzodiazepines: NOT DETECTED
COCAINE: NOT DETECTED
OPIATES: NOT DETECTED
TETRAHYDROCANNABINOL: NOT DETECTED

## 2017-12-04 LAB — CBC
HCT: 39.9 % (ref 36.0–46.0)
HEMOGLOBIN: 13.5 g/dL (ref 12.0–15.0)
MCH: 31.3 pg (ref 26.0–34.0)
MCHC: 33.8 g/dL (ref 30.0–36.0)
MCV: 92.6 fL (ref 78.0–100.0)
Platelets: 296 10*3/uL (ref 150–400)
RBC: 4.31 MIL/uL (ref 3.87–5.11)
RDW: 13.8 % (ref 11.5–15.5)
WBC: 8.9 10*3/uL (ref 4.0–10.5)

## 2017-12-04 LAB — I-STAT BETA HCG BLOOD, ED (MC, WL, AP ONLY): I-stat hCG, quantitative: <5 m[IU]/mL (ref <5)

## 2017-12-04 LAB — ACETAMINOPHEN LEVEL

## 2017-12-04 LAB — SALICYLATE LEVEL
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
## 2017-12-04 NOTE — BH Assessment (Signed)
[facility] Assessment Progress Note  Per Nira ConnJason Berry, NP pt does not meet criteria for inpt treatment. EDP Elpidio AnisUpstill, Shari, PA-C in agreement and states she will rescind the IVC. Pt's nurse Jillyn HiddenGary, RN advised of disposition.   Princess BruinsAquicha Gilmar Bua, MSW, LCSW Therapeutic Triage Specialist  [phone]

## 2017-12-04 NOTE — ED Notes (Signed)
Pt. Transferred to [facility] from ED to room 35 after screening for contraband. Report to include Situation, Background, Assessment and [facility]ations [facility]. Pt. Oriented to unit including Q15 minute rounds as well as the security cameras for their protection. Patient is alert and oriented, warm and dry in no acute distress. Patient denies SI, HI, and AVH. Pt. Encouraged to let me know if needs arise.

## 2017-12-04 NOTE — ED Notes (Signed)
Hourly rounding reveals patient in room talking to TTS. No complaints, stable, in no acute distress. Q15 minute rounds and monitoring via Security Cameras to continue. 
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
## 2017-12-04 NOTE — Discharge Instructions (Signed)
Followup with your doctor as needed

## 2017-12-13 NOTE — L&D Delivery Note (Addendum)
Patient: Janet [name] MRN: [id number]  GBS status: negative  Patient is a 22 y.o. now G3P3 s/p NSVD at 2830w1d, who was admitted for IOL due to oligohydramnios (AFI 2). SROM prior to delivery unobserved due to low volume of fluid by noted to be ruptured at the time of pushing with clear fluid.    Delivery Note At a viable baby boy was delivered via SVD (Presentation: vertex).   Head delivered LOP. No nuchal cord present. Shoulder and body delivered in usual fashion. Infant with spontaneous cry, placed on mother's abdomen, dried and bulb suctioned. Cord clamped x 2 after 1-minute delay, and cut by provider. Cord blood drawn. Placenta delivered spontaneously with gentle cord traction. Fundus firm with massage and Pitocin. Perineum inspected and found to have no laceration. APGAR: 8, 9; weight 4030 g   Placenta status: intact, 3 vessel cord .    Anesthesia:  Epidural Episiotomy: None  Lacerations: None Est. Blood Loss (mL):  54   Mom to postpartum.  Baby to Couplet care / Skin to Skin.  Awilda MetroSarah A Peiffer 11/14/2018, 7:40 PM   Midwife attestation: I was gloved and present for delivery in its entirety and I agree with the above resident's note.  Donette LarryMelanie Beauden Tremont, CNM 10:14 AM

## 2018-06-30 ENCOUNTER — Ambulatory Visit: Payer: Medicaid Other

## 2018-06-30 ENCOUNTER — Encounter: Payer: Self-pay | Admitting: Family Medicine

## 2018-06-30 DIAGNOSIS — Z3201 Encounter for pregnancy test, result positive: Secondary | ICD-10-CM

## 2018-06-30 LAB — POCT PREGNANCY, URINE: PREG TEST UR: POSITIVE — AB

## 2018-06-30 NOTE — Progress Notes (Signed)
Pt here today for pregnancy test.  Resulted +.  Video Interpreter # [id number].  Pt reports LMP 04/09/18.  EDD 01/14/2019  11w 5d today.   Pt states that she did not realize she was pregnant because had irregular periods and she did not have any sx like her prior pregnancy.  Verified pt medications.  List of medication safe to take pregnancy list given to pt.  Proof of pregnancy letter provided by front office to start prenatal care.

## 2018-07-13 LAB — OB RESULTS CONSOLE ANTIBODY SCREEN: Antibody Screen: NEGATIVE

## 2018-07-13 LAB — OB RESULTS CONSOLE RUBELLA ANTIBODY, IGM: RUBELLA: IMMUNE

## 2018-07-13 LAB — OB RESULTS CONSOLE GC/CHLAMYDIA
CHLAMYDIA, DNA PROBE: NEGATIVE
Gonorrhea: NEGATIVE

## 2018-07-13 LAB — OB RESULTS CONSOLE ABO/RH: RH TYPE: POSITIVE

## 2018-07-13 LAB — OB RESULTS CONSOLE HIV ANTIBODY (ROUTINE TESTING): HIV: NONREACTIVE

## 2018-07-13 LAB — OB RESULTS CONSOLE HEPATITIS B SURFACE ANTIGEN: HEP B S AG: NEGATIVE

## 2018-07-13 LAB — OB RESULTS CONSOLE RPR: RPR: NONREACTIVE

## 2018-10-17 LAB — OB RESULTS CONSOLE GBS: GBS: NEGATIVE

## 2018-11-13 ENCOUNTER — Other Ambulatory Visit (HOSPITAL_COMMUNITY): Payer: Self-pay | Admitting: Family

## 2018-11-13 ENCOUNTER — Encounter (HOSPITAL_COMMUNITY): Payer: Self-pay | Admitting: *Deleted

## 2018-11-13 ENCOUNTER — Telehealth (HOSPITAL_COMMUNITY): Payer: Self-pay | Admitting: *Deleted

## 2018-11-13 DIAGNOSIS — O480 Post-term pregnancy: Secondary | ICD-10-CM

## 2018-11-13 NOTE — Telephone Encounter (Signed)
Preadmission screen  

## 2018-11-14 ENCOUNTER — Inpatient Hospital Stay (HOSPITAL_COMMUNITY): Payer: Medicaid Other | Admitting: Anesthesiology

## 2018-11-14 ENCOUNTER — Encounter (HOSPITAL_COMMUNITY): Payer: Self-pay

## 2018-11-14 ENCOUNTER — Ambulatory Visit (HOSPITAL_BASED_OUTPATIENT_CLINIC_OR_DEPARTMENT_OTHER)
Admission: RE | Admit: 2018-11-14 | Discharge: 2018-11-14 | Disposition: A | Discharge status: Home / Self Care | Payer: Medicaid Other | Source: Ambulatory Visit | Attending: Family | Admitting: Family

## 2018-11-14 ENCOUNTER — Inpatient Hospital Stay (HOSPITAL_COMMUNITY)
Admission: AD | Admit: 2018-11-14 | Discharge: 2018-11-15 | DRG: 807 | Disposition: A | Discharge status: Home / Self Care | Payer: Medicaid Other | Attending: Family Medicine | Admitting: Family Medicine

## 2018-11-14 DIAGNOSIS — O9902 Anemia complicating childbirth: Secondary | ICD-10-CM | POA: Diagnosis present

## 2018-11-14 DIAGNOSIS — Z3A40 40 weeks gestation of pregnancy: Secondary | ICD-10-CM | POA: Insufficient documentation

## 2018-11-14 DIAGNOSIS — O480 Post-term pregnancy: Secondary | ICD-10-CM | POA: Insufficient documentation

## 2018-11-14 DIAGNOSIS — D649 Anemia, unspecified: Secondary | ICD-10-CM | POA: Diagnosis present

## 2018-11-14 DIAGNOSIS — O4103X0 Oligohydramnios, third trimester, not applicable or unspecified: Secondary | ICD-10-CM | POA: Diagnosis present

## 2018-11-14 DIAGNOSIS — O4100X0 Oligohydramnios, unspecified trimester, not applicable or unspecified: Secondary | ICD-10-CM | POA: Diagnosis present

## 2018-11-14 LAB — CBC
HCT: 33.5 % — ABNORMAL LOW (ref 36.0–46.0)
Hemoglobin: 10.5 g/dL — ABNORMAL LOW (ref 12.0–15.0)
MCH: 29 pg (ref 26.0–34.0)
MCHC: 31.3 g/dL (ref 30.0–36.0)
MCV: 92.5 fL (ref 80.0–100.0)
Platelets: 254 10*3/uL (ref 150–400)
RBC: 3.62 MIL/uL — ABNORMAL LOW (ref 3.87–5.11)
RDW: 16 % — ABNORMAL HIGH (ref 11.5–15.5)
WBC: 8.1 10*3/uL (ref 4.0–10.5)
nRBC: 0.2 % (ref 0.0–0.2)

## 2018-11-14 LAB — RAPID URINE DRUG SCREEN, HOSP PERFORMED
Amphetamines: NOT DETECTED
Barbiturates: NOT DETECTED
Benzodiazepines: NOT DETECTED
Cocaine: NOT DETECTED
Opiates: NOT DETECTED
Tetrahydrocannabinol: NOT DETECTED

## 2018-11-14 LAB — TYPE AND SCREEN
ABO/RH(D): A POS
Antibody Screen: NEGATIVE

## 2018-11-14 MED ORDER — PHENYLEPHRINE 40 MCG/ML (10ML) SYRINGE FOR IV PUSH (FOR BLOOD PRESSURE SUPPORT)
80.0000 ug | PREFILLED_SYRINGE | INTRAVENOUS | Status: DC | PRN
  Filled 2018-11-14: qty 10

## 2018-11-14 MED ORDER — PRENATAL MULTIVITAMIN CH: 1.0000 | ORAL_TABLET | Freq: Every day | ORAL | Status: DC

## 2018-11-14 MED ORDER — IBUPROFEN 600 MG PO TABS
600.0000 mg | ORAL_TABLET | Freq: Four times a day (QID) | ORAL | Status: DC
  Administered 2018-11-14 – 2018-11-15 (×4): 600 mg via ORAL
  Filled 2018-11-14 (×4): qty 1

## 2018-11-14 MED ORDER — FENTANYL 2.5 MCG/ML BUPIVACAINE 1/10 % EPIDURAL INFUSION (WH - ANES)
14.0000 mL/h | INTRAMUSCULAR | Status: DC | PRN
  Filled 2018-11-14: qty 100

## 2018-11-14 MED ORDER — DIPHENHYDRAMINE HCL 50 MG/ML IJ SOLN: 12.5000 mg | INTRAMUSCULAR | Status: DC | PRN

## 2018-11-14 MED ORDER — OXYCODONE-ACETAMINOPHEN 5-325 MG PO TABS: 2.0000 | ORAL_TABLET | ORAL | Status: DC | PRN

## 2018-11-14 MED ORDER — SENNOSIDES-DOCUSATE SODIUM 8.6-50 MG PO TABS
2.0000 | ORAL_TABLET | ORAL | Status: DC
  Administered 2018-11-14: 2 via ORAL
  Filled 2018-11-14: qty 2

## 2018-11-14 MED ORDER — OXYCODONE-ACETAMINOPHEN 5-325 MG PO TABS: 1.0000 | ORAL_TABLET | ORAL | Status: DC | PRN

## 2018-11-14 MED ORDER — LACTATED RINGERS IV SOLN: 500.0000 mL | INTRAVENOUS | Status: DC | PRN

## 2018-11-14 MED ORDER — ONDANSETRON HCL 4 MG PO TABS: 4.0000 mg | ORAL_TABLET | ORAL | Status: DC | PRN

## 2018-11-14 MED ORDER — FERROUS SULFATE 325 (65 FE) MG PO TABS
325.0000 mg | ORAL_TABLET | Freq: Every day | ORAL | Status: DC
  Administered 2018-11-15: 325 mg via ORAL
  Filled 2018-11-14: qty 1

## 2018-11-14 MED ORDER — EPHEDRINE 5 MG/ML INJ: 10.0000 mg | INTRAVENOUS | Status: DC | PRN

## 2018-11-14 MED ORDER — LACTATED RINGERS IV SOLN
INTRAVENOUS | Status: DC
  Administered 2018-11-14: 12:00:00 via INTRAVENOUS

## 2018-11-14 MED ORDER — PRENATAL MULTIVITAMIN CH
1.0000 | ORAL_TABLET | Freq: Every day | ORAL | Status: DC
  Administered 2018-11-15: 1 via ORAL
  Filled 2018-11-14: qty 1

## 2018-11-14 MED ORDER — FENTANYL CITRATE (PF) 100 MCG/2ML IJ SOLN
100.0000 ug | INTRAMUSCULAR | Status: DC | PRN
  Administered 2018-11-14 (×2): 100 ug via INTRAVENOUS
  Filled 2018-11-14 (×2): qty 2

## 2018-11-14 MED ORDER — SOD CITRATE-CITRIC ACID 500-334 MG/5ML PO SOLN: 30.0000 mL | ORAL | Status: DC | PRN

## 2018-11-14 MED ORDER — TERBUTALINE SULFATE 1 MG/ML IJ SOLN: 0.2500 mg | Freq: Once | INTRAMUSCULAR | Status: DC | PRN

## 2018-11-14 MED ORDER — DIBUCAINE 1 % RE OINT: 1.0000 "application " | TOPICAL_OINTMENT | RECTAL | Status: DC | PRN

## 2018-11-14 MED ORDER — LACTATED RINGERS IV SOLN: 500.0000 mL | Freq: Once | INTRAVENOUS | Status: DC

## 2018-11-14 MED ORDER — OXYTOCIN BOLUS FROM INFUSION
500.0000 mL | Freq: Once | INTRAVENOUS | Status: AC
  Administered 2018-11-14: 500 mL via INTRAVENOUS

## 2018-11-14 MED ORDER — DIPHENHYDRAMINE HCL 25 MG PO CAPS: 25.0000 mg | ORAL_CAPSULE | Freq: Four times a day (QID) | ORAL | Status: DC | PRN

## 2018-11-14 MED ORDER — WITCH HAZEL-GLYCERIN EX PADS: 1.0000 "application " | MEDICATED_PAD | CUTANEOUS | Status: DC | PRN

## 2018-11-14 MED ORDER — OXYTOCIN 40 UNITS IN LACTATED RINGERS INFUSION - SIMPLE MED
2.5000 [IU]/h | INTRAVENOUS | Status: DC
  Administered 2018-11-14: 2.5 [IU]/h via INTRAVENOUS
  Filled 2018-11-14: qty 1000

## 2018-11-14 MED ORDER — ACETAMINOPHEN 325 MG PO TABS
650.0000 mg | ORAL_TABLET | ORAL | Status: DC | PRN
  Administered 2018-11-15 (×2): 650 mg via ORAL

## 2018-11-14 MED ORDER — FENTANYL 2.5 MCG/ML BUPIVACAINE 1/10 % EPIDURAL INFUSION (WH - ANES)
INTRAMUSCULAR | Status: DC | PRN
  Administered 2018-11-14: 14 mL/h via EPIDURAL

## 2018-11-14 MED ORDER — BENZOCAINE-MENTHOL 20-0.5 % EX AERO: 1.0000 "application " | INHALATION_SPRAY | CUTANEOUS | Status: DC | PRN

## 2018-11-14 MED ORDER — ONDANSETRON HCL 4 MG/2ML IJ SOLN: 4.0000 mg | Freq: Four times a day (QID) | INTRAMUSCULAR | Status: DC | PRN

## 2018-11-14 MED ORDER — OXYTOCIN 40 UNITS IN LACTATED RINGERS INFUSION - SIMPLE MED: 2.5000 [IU]/h | INTRAVENOUS | Status: DC | PRN

## 2018-11-14 MED ORDER — ONDANSETRON HCL 4 MG/2ML IJ SOLN: 4.0000 mg | INTRAMUSCULAR | Status: DC | PRN

## 2018-11-14 MED ORDER — OXYTOCIN 40 UNITS IN LACTATED RINGERS INFUSION - SIMPLE MED
1.0000 m[IU]/min | INTRAVENOUS | Status: DC
  Administered 2018-11-14: 2 m[IU]/min via INTRAVENOUS

## 2018-11-14 MED ORDER — ZOLPIDEM TARTRATE 5 MG PO TABS: 5.0000 mg | ORAL_TABLET | Freq: Every evening | ORAL | Status: DC | PRN

## 2018-11-14 MED ORDER — LIDOCAINE HCL (PF) 1 % IJ SOLN
INTRAMUSCULAR | Status: DC | PRN
  Administered 2018-11-14 (×2): 6 mL via EPIDURAL

## 2018-11-14 MED ORDER — TERBUTALINE SULFATE 1 MG/ML IJ SOLN
0.2500 mg | Freq: Once | INTRAMUSCULAR | Status: DC | PRN
  Filled 2018-11-14: qty 1

## 2018-11-14 MED ORDER — ACETAMINOPHEN 325 MG PO TABS
650.0000 mg | ORAL_TABLET | ORAL | Status: DC | PRN
  Filled 2018-11-14 (×2): qty 2

## 2018-11-14 MED ORDER — SIMETHICONE 80 MG PO CHEW: 80.0000 mg | CHEWABLE_TABLET | ORAL | Status: DC | PRN

## 2018-11-14 MED ORDER — PHENYLEPHRINE 40 MCG/ML (10ML) SYRINGE FOR IV PUSH (FOR BLOOD PRESSURE SUPPORT): 80.0000 ug | PREFILLED_SYRINGE | INTRAVENOUS | Status: DC | PRN

## 2018-11-14 MED ORDER — LIDOCAINE HCL (PF) 1 % IJ SOLN
30.0000 mL | INTRAMUSCULAR | Status: DC | PRN
  Filled 2018-11-14: qty 30

## 2018-11-14 MED ORDER — COCONUT OIL OIL: 1.0000 "application " | TOPICAL_OIL | Status: DC | PRN

## 2018-11-14 NOTE — Anesthesia Pain Management Evaluation Note (Signed)
  [facility] Pain Management Visit Note  Patient: Janet [name], 22 y.o., female  "Hello I am a [facility] the [facility]. We have an anesthesia team available at all times to provide care throughout the hospital, including epidural management and anesthesia for C-section. I don't know your plan for the delivery whether it a natural birth, water birth, IV sedation, nitrous supplementation, doula or epidural, but we want to meet your pain goals."   1.Was your pain managed to your expectations on prior hospitalizations?   Yes   2.What is your expectation for pain management during this hospitalization?     Labor support without medications and Epidural - If necessary   3.How can we help you reach that goal? Support PRN  Record the patient's initial score and the patient's pain goal.   Pain: 4  Pain Goal: 8 [facility] wants you to be able to say your pain was always managed very well.  Jennelle HumanLisa B Hawken Bielby 11/14/2018

## 2018-11-14 NOTE — Progress Notes (Signed)
LABOR PROGRESS NOTE  Janet [name] is a 22 y.o. G3P2002 at 5752w1d  admitted for IOL for Oligohydramnios (AFI 2).  Subjective: Patient more uncomfortable reporting more pain with contractions. States desire for epidural at this time.   Objective: BP 114/67   Pulse 71   Temp 98.1 F (36.7 C) (Oral)   Resp 18   Ht 5\' 2"  (1.575 m)   Wt 73.3 kg   BMI 29.58 kg/m  or  Vitals:   11/14/18 1501 11/14/18 1531 11/14/18 1601 11/14/18 1635  BP: 113/69 115/72 115/69 114/67  Pulse: 72 72 78 71  Resp: 18 18 18 18   Temp:      TempSrc:      Weight:      Height:        Dilation: 3.5 Effacement (%): 70 Cervical Position: Posterior Station: Ballotable Presentation: Vertex Exam by:: Foye ClockS. Oklesh RN FHT: baseline rate 120, moderate variability, acel present, no decel Toco: Cx q623m  Labs: Lab Results  Component Value Date   WBC 8.1 11/14/2018   HGB 10.5 (L) 11/14/2018   HCT 33.5 (L) 11/14/2018   MCV 92.5 11/14/2018   PLT 254 11/14/2018    Patient Active Problem List   Diagnosis Date Noted  . Oligohydramnios 11/14/2018  . Self-inflicted laceration of wrist   . MDD (major depressive disorder), single episode, severe (HCC) 11/22/2017  . Pregnancy 07/16/2015  . [redacted] weeks gestation of pregnancy   . Encounter for fetal anatomic survey     Assessment / Plan: 22 y.o. G3P2002 at 4952w1d here for IOL for Oligohydramnios (AFI 2).  Labor: Progressing with painful contractions. Continue to monitor cervical exam, little change from admission. Fetal Wellbeing:  Tolerating well. Continue to monitor. Pain Control:  Epidural pending. Anticipated MOD:  Vaginal  Wendie AgresteSarah Asman Zykeriah Mathia, MD PGY-1 Family Medicine Resident 11/14/2018, 5:10 PM

## 2018-11-14 NOTE — Anesthesia Preprocedure Evaluation (Signed)
Anesthesia Evaluation  Patient identified by MRN, date of birth, ID band Patient awake    Reviewed: Allergy & Precautions, H&P , NPO status , Patient's Chart, lab work & pertinent test results  Airway Mallampati: I  TM Distance: >3 FB Neck ROM: full    Dental no notable dental hx. (+) Teeth Intact   Pulmonary neg pulmonary ROS,    Pulmonary exam normal breath sounds clear to auscultation       Cardiovascular negative cardio ROS Normal cardiovascular exam Rhythm:regular Rate:Normal     Neuro/Psych negative neurological ROS  negative psych ROS   GI/Hepatic negative GI ROS, Neg liver ROS,   Endo/Other  negative endocrine ROS  Renal/GU negative Renal ROS     Musculoskeletal   Abdominal Normal abdominal exam  (+)   Peds  Hematology  (+) Blood dyscrasia, anemia ,   Anesthesia Other Findings   Reproductive/Obstetrics (+) Pregnancy                             Anesthesia Physical Anesthesia Plan  ASA: II  Anesthesia Plan: Epidural   Post-op Pain Management:    Induction:   PONV Risk Score and Plan:   Airway Management Planned:   Additional Equipment:   Intra-op Plan:   Post-operative Plan:   Informed Consent: I have reviewed the patients History and Physical, chart, labs and discussed the procedure including the risks, benefits and alternatives for the proposed anesthesia with the patient or authorized representative who has indicated his/her understanding and acceptance.       Plan Discussed with:   Anesthesia Plan Comments:         Anesthesia Quick Evaluation  

## 2018-11-14 NOTE — H&P (Signed)
OBSTETRIC ADMISSION HISTORY AND PHYSICAL  Freda Jacksonsha Maglione is a 22 y.o. female 683P2002 with IUP at 6845w1d presenting for IOL for Oligohydramnios (AFI 2). She reports +FMs. No LOF, VB, blurry vision, headaches, peripheral edema, or RUQ pain. She plans on bottlefeeding. She requests Nexplanon for birth control.  Dating: By LMP --->  Estimated Date of Delivery: 11/13/18  Prenatal History/Complications: - anemia - drug use - language barrier  Past Medical History: Past Medical History:  Diagnosis Date  . Anemia     Past Surgical History: History reviewed. No pertinent surgical history.  Obstetrical History: OB History    Gravida  3   Para  2   Term  2   Preterm  0   AB  0   Living  2     SAB  0   TAB  0   Ectopic  0   Multiple  0   Live Births  2           Social History: Social History   Socioeconomic History  . Marital status: Married    Spouse name: Not on file  . Number of children: Not on file  . Years of education: Not on file  . Highest education level: Not on file  Occupational History  . Not on file  Social Needs  . Financial resource strain: Not on file  . Food insecurity:    Worry: Not on file    Inability: Not on file  . Transportation needs:    Medical: Not on file    Non-medical: Not on file  Tobacco Use  . Smoking status: Never Smoker  . Smokeless tobacco: Never Used  Substance and Sexual Activity  . Alcohol use: No  . Drug use: No  . Sexual activity: Never  Lifestyle  . Physical activity:    Days per week: Not on file    Minutes per session: Not on file  . Stress: Not on file  Relationships  . Social connections:    Talks on phone: Not on file    Gets together: Not on file    Attends religious service: Not on file    Active member of club or organization: Not on file    Attends meetings of clubs or organizations: Not on file    Relationship status: Not on file  Other Topics Concern  . Not on file  Social History Narrative   . Not on file    Family History: History reviewed. No pertinent family history.  Allergies: No Known Allergies  Medications Prior to Admission  Medication Sig Dispense Refill Last Dose  . ferrous sulfate 325 (65 FE) MG tablet Take 325 mg by mouth daily with breakfast.   Past Week at Unknown time  . Prenatal Vit-Fe Fumarate-FA (PRENATAL MULTIVITAMIN) TABS tablet Take 1 tablet by mouth daily at 12 noon.   Past Week at Unknown time     Review of Systems   All systems reviewed and negative except as stated in HPI  Blood pressure 119/67, pulse 74, temperature 98.2 F (36.8 C), temperature source Oral, resp. rate 18, height 5\' 2"  (1.575 m), weight 73.3 kg, unknown if currently breastfeeding. General appearance: alert, cooperative and no distress Lungs: regular rate and effort Heart: regular rate  Abdomen: soft, non-tender Extremities: Homans sign is negative, no sign of DVT Presentation: cephalic Fetal monitoringBaseline: 125 bpm, Variability: Good {> 6 bpm), Accelerations: Reactive and Decelerations: Absent Uterine activityFrequency: Every 2-5 minutes Dilation: 3 Effacement (%): 70 Exam by::  Azyriah Nevins CNM   Prenatal labs: ABO, Rh: --/--/A POS (12/03 1156) Antibody: PENDING (12/03 1156) Rubella: Immune (08/01 0000) RPR: Nonreactive (08/01 0000)  HBsAg: Negative (08/01 0000)  HIV: Non-reactive (08/01 0000)  GBS: Negative (11/05 0000)  1 hr GTT nml  Prenatal Transfer Tool  Maternal Diabetes: No Genetic Screening: Declined Maternal Ultrasounds/Referrals: Normal Fetal Ultrasounds or other Referrals:  None Maternal Substance Abuse:  Yes:  Type: Other: etoh Significant Maternal Medications:  None Significant Maternal Lab Results: None  Results for orders placed or performed during the hospital encounter of 11/14/18 (from the past 24 hour(s))  CBC   Collection Time: 11/14/18 11:56 AM  Result Value Ref Range   WBC 8.1 4.0 - 10.5 K/uL   RBC 3.62 (L) 3.87 - 5.11 MIL/uL    Hemoglobin 10.5 (L) 12.0 - 15.0 g/dL   HCT 24.4 (L) 01.0 - 27.2 %   MCV 92.5 80.0 - 100.0 fL   MCH 29.0 26.0 - 34.0 pg   MCHC 31.3 30.0 - 36.0 g/dL   RDW 53.6 (H) 64.4 - 03.4 %   Platelets 254 150 - 400 K/uL   nRBC 0.2 0.0 - 0.2 %  Type and screen [facility]   Collection Time: 11/14/18 11:56 AM  Result Value Ref Range   ABO/RH(D) A POS    Antibody Screen PENDING    Sample Expiration      11/17/2018 Performed at [facility], [address] [place], [place] [address]     Patient Active Problem List   Diagnosis Date Noted  . Oligohydramnios 11/14/2018  . Self-inflicted laceration of wrist   . MDD (major depressive disorder), single episode, severe (HCC) 11/22/2017  . Pregnancy 07/16/2015  . [redacted] weeks gestation of pregnancy   . Encounter for fetal anatomic survey     Assessment: Patina Spanier is a 22 y.o. G3P2002 at [redacted]w[redacted]d here for IOL for Oligohydramnios  1. Labor: latent 2. FWB: Cat I 3. Pain: analgesia/anesthesia prn 4. GBS: neg   Plan: Admit to BS IOL with Pitocin Anticipate SVD  Donette Larry, CNM  11/14/2018, 12:49 PM

## 2018-11-14 NOTE — Anesthesia Procedure Notes (Signed)
Epidural Patient location during procedure: OB Start time: 11/14/2018 5:57 PM End time: 11/14/2018 6:02 PM  Staffing Anesthesiologist: Leilani AbleHatchett, Nitasha Jewel, MD Performed: anesthesiologist   Preanesthetic Checklist Completed: patient identified, site marked, surgical consent, pre-op evaluation, timeout performed, IV checked, risks and benefits discussed and monitors and equipment checked  Epidural Patient position: sitting Prep: site prepped and draped and DuraPrep Patient monitoring: continuous pulse ox and blood pressure Approach: midline Location: L3-L4 Injection technique: LOR air  Needle:  Needle type: Tuohy  Needle gauge: 17 G Needle length: 9 cm and 9 Needle insertion depth: 5 cm cm Catheter type: closed end flexible Catheter size: 19 Gauge Catheter at skin depth: 10 cm Test dose: negative and Other  Assessment Sensory level: T9 Events: blood not aspirated, injection not painful, no injection resistance, negative IV test and no paresthesia  Additional Notes Reason for block:procedure for pain

## 2018-11-14 NOTE — Progress Notes (Signed)
Interpretor video Lavina HammanGhanendra [phone] used for delivery and post partum.

## 2018-11-14 NOTE — Progress Notes (Signed)
Video interpreter [phone]#[phone] used for admission and education.

## 2018-11-14 NOTE — MAU Note (Signed)
Pt seen today in [facility] by Dr. Grace BushyBooker.  [facility].  Pt to be admit to Room 164 per Dr. Adrian BlackwaterStinson.  Pt ambulated to MAU for admission only.

## 2018-11-15 LAB — RPR: RPR Ser Ql: NONREACTIVE

## 2018-11-15 MED ORDER — IBUPROFEN 600 MG PO TABS: 600.0000 mg | ORAL_TABLET | Freq: Four times a day (QID) | ORAL | 0 refills | Status: DC

## 2018-11-15 NOTE — Progress Notes (Signed)
CSW left a voicemail message for CPS worker P. Miller.  CSW requested a return call to make a report for alcohol use during pregnancy.    There are no barriers to discharge.   Laniesha Das Boyd-Gilyard, MSW, LCSW Clinical Social Work [phone]  

## 2018-11-15 NOTE — Progress Notes (Signed)
POSTPARTUM PROGRESS NOTE  Post Partum Day 1  Subjective:  Janet [name] is a 22 y.o. [id number] s/p NSVD at 3524w1d.  She reports she is doing well. No acute events overnight. She denies any problems with ambulating, voiding or po intake. Denies nausea or vomiting.  Pain is well controlled.  Lochia is decreased. No dizziness. She will be receiving help at home from her parents. She requests a work note for her father and FMLA paperwork. A video interpreter was used.     Objective: Blood pressure 90/60, pulse 62, temperature 98.3 F (36.8 C), temperature source Oral, resp. rate 18, height 5\' 2"  (1.575 m), weight 73.3 kg, SpO2 98 %, unknown if currently breastfeeding.  Physical Exam:  General: alert, cooperative and no distress Chest: no respiratory distress Heart:regular rate, distal pulses intact Abdomen: soft, nontender,  Uterine Fundus: firm, appropriately tender DVT Evaluation: No calf swelling or tenderness Extremities: No edema Skin: warm, dry  Recent Labs    11/14/18 1156  HGB 10.5*  HCT 33.5*    Assessment/Plan: Janet Jacksonsha Temkin is a 22 y.o. [id number] s/p NSVD at 5524w1d   PPD#1 - Doing well  Routine postpartum care FMLA paperwork completed for mother and given to patient with instructions.  Contraception: Nexplanon Feeding: Bottle Dispo: Plan for discharge tomorrow. Patient requested discharge this evening but infant is to stay because of vital sign changes per RN.   LOS: 1 day   Attestation: I have seen this patient and agree with the medical student's Trenda Moots(Elizabeth Barefoot) documentation. I have examined them separately, and we have discussed the plan of care.  Cristal DeerLaurel S. Earlene PlaterWallace, DO OB/GYN Fellow

## 2018-11-15 NOTE — Discharge Summary (Signed)
Obstetrics Discharge Summary OB/GYN Faculty Practice   Patient Name: Janet [name] DOB: 05/31/1996 MRN: [id number]  Date of admission: 11/14/2018 Delivering MD: Donette LarryBHAMBRI, MELANIE   Date of discharge: 11/15/2018  Admitting diagnosis: 40WKS INDUCTION Intrauterine pregnancy: 283w1d     Secondary diagnosis:   Active Problems:   Oligohydramnios    Discharge diagnosis: Term Pregnancy Delivered                      Postpartum procedures: None  Complications: none  Outpatient Follow-Up: [ ]  desires postpartum Nexplanon placement  Hospital course: Janet [name] is a 22 y.o. 383w1d who was admitted for IOL for oligohydramnios (AFI 2). Her pregnancy was complicated by same. Her labor course was notable for induction with pitocin, SROM, epidural placement. Delivery was uncomplicated. Please see delivery/op note for additional details. Her postpartum course was uncomplicated. She was breastfeeding without difficulty. By day of discharge, she was passing flatus, urinating, eating and drinking without difficulty. Her pain was well-controlled, and she was discharged home with ibuprofen. She will follow-up in clinic in 4-6 weeks.   Physical exam  Vitals:   11/14/18 2330 11/15/18 0330 11/15/18 0730 11/15/18 1210  BP: 118/71 90/60 (!) 87/55 110/73  Pulse: 74 62 63 63  Resp: 16 18 16 16   Temp: 98.3 F (36.8 C) 98.3 F (36.8 C) 98 F (36.7 C) 98 F (36.7 C)  TempSrc: Oral Oral Oral Oral  SpO2: 100% 98% 98% 98%  Weight:      Height:       General: well-appearing, NAD Lochia: appropriate Uterine Fundus: firm Incision: N/A DVT Evaluation: No significant calf/ankle edema. Labs: Lab Results  Component Value Date   WBC 8.1 11/14/2018   HGB 10.5 (L) 11/14/2018   HCT 33.5 (L) 11/14/2018   MCV 92.5 11/14/2018   PLT 254 11/14/2018   CMP Latest Ref Rng & Units 12/04/2017  Glucose 65 - 99 mg/dL 119(J107(H)  BUN 6 - 20 mg/dL 9  Creatinine 4.780.44 - 2.951.00 mg/dL 6.210.59  Sodium 308135 - 657145 mmol/L 140  Potassium 3.5 - 5.1  mmol/L 3.7  Chloride 101 - 111 mmol/L 105  CO2 22 - 32 mmol/L 27  Calcium 8.9 - 10.3 mg/dL 9.2  Total Protein 6.5 - 8.1 g/dL 8.2(H)  Total Bilirubin 0.3 - 1.2 mg/dL 0.5  Alkaline Phos 38 - 126 U/L 68  AST 15 - 41 U/L 23  ALT 14 - 54 U/L 18    Discharge instructions: Per After Visit Summary and "Baby and Me Booklet"  After visit meds:  Allergies as of 11/15/2018   No Known Allergies     Medication List    TAKE these medications   ferrous sulfate 325 (65 FE) MG tablet Take 325 mg by mouth daily with breakfast.   ibuprofen 600 MG tablet Commonly known as:  ADVIL,MOTRIN Take 1 tablet (600 mg total) by mouth every 6 (six) hours.   prenatal multivitamin Tabs tablet Take 1 tablet by mouth daily at 12 noon.      Postpartum contraception: Nexplanon Diet: Routine Diet Activity: Advance as tolerated. Pelvic rest for 6 weeks.   Follow-up Appt: Patient encouraged to call [facility] to make appointment.  Newborn Data: Live born female  Birth Weight: 8 lb 14.2 oz (4030 g) APGAR: 8, 9  Newborn Delivery   Birth date/time:  11/14/2018 20:09:00 Delivery type:  Vaginal, Spontaneous     Baby Feeding: Breast Disposition:home with mother  Cristal DeerLaurel S. Earlene PlaterWallace, DO OB/GYN Fellow, Faculty Practice

## 2018-11-15 NOTE — Progress Notes (Signed)
Discharge instructions provided via video interpreter; [place].

## 2018-11-15 NOTE — Anesthesia Postprocedure Evaluation (Signed)
Anesthesia Post Note  Patient: Janet [name]  Procedure(s) Performed: AN AD HOC LABOR EPIDURAL     Patient location during evaluation: Mother Baby Anesthesia Type: Epidural Level of consciousness: awake, awake and alert and oriented Pain management: pain level controlled Vital Signs Assessment: post-procedure vital signs reviewed and stable Respiratory status: spontaneous breathing Cardiovascular status: blood pressure returned to baseline Postop Assessment: no headache, no backache, epidural receding, patient able to bend at knees, no apparent nausea or vomiting, adequate PO intake and able to ambulate Anesthetic complications: no    Last Vitals:  Vitals:   11/14/18 2330 11/15/18 0330  BP: 118/71 90/60  Pulse: 74 62  Resp: 16 18  Temp: 36.8 C 36.8 C  SpO2: 100% 98%    Last Pain:  Vitals:   11/15/18 0541  TempSrc:   PainSc: 0-No pain   Pain Goal:                 Cleda ClarksBrowder, Nikkole Placzek R

## 2018-11-20 ENCOUNTER — Inpatient Hospital Stay (HOSPITAL_COMMUNITY): Admission: RE | Admit: 2018-11-20 | Payer: Medicaid Other | Source: Ambulatory Visit

## 2019-03-04 ENCOUNTER — Emergency Department: Admit: 2019-03-05

## 2019-03-04 DIAGNOSIS — R0602 Shortness of breath: Secondary | ICD-10-CM

## 2019-03-04 NOTE — ED Provider Notes (Signed)
I personally evaluated and examined the patient in conjunction with the APC and agree with the assessment, treatment plan and disposition of the patient has recorded by the APC.     I reviewed pertinent nurse's notes, triage notes, vital signs, past medical history, family and social history, medications, and allergies.     Complete review of systems was conducted by the mid-level provider and/or myself. Review of systems is negative except as documented in the history of present illness.       This patient came in with some shortness of breath.  She is afebrile and nontoxic.  On exam, heart regular rate and rhythm and lungs are clear.  Her vital signs are stable.  I came to see her, I talked her husband on the phone and he stated that she had to get up early for something and that she needed to leave.  Patient tell me she is feeling fine.  I think she is safe to be discharged    FINAL IMPRESSION     1. Shortness of breath            Electronically signed by:   Donella Stade. Cleotis Lema, M.D.              Leticia Clas, MD  03/05/19 [phone]

## 2019-03-04 NOTE — ED Provider Notes (Signed)
[facility]        Pt Name: Olivia Sawyer  MRN: [id number]  Birthdate Feb 16, 1996  Date of evaluation: 03/04/2019  Provider: Verdon Cummins, PA  PCP: No primary care provider on file.     I have seen and evaluated this patient with my supervising physician [facility]    CHIEF COMPLAINT       Chief Complaint   Patient presents with   ??? Shortness of Breath     sob and chest pain that started one hour ago. denies recent travel, cough, fever.        HISTORY OF PRESENT ILLNESS   (Location, Timing/Onset, Context/Setting, Quality, Duration, Modifying Factors, Severity, Associated Signs and Symptoms)  Note limiting factors.     Olivia Sawyer is a 23 y.o. female with no significant past medical history who presents to the ED implant of shortness of breath.  Patient states she has had shortness of breath and chest pain that began about an hour prior to arrival.  Upon my examination patient denies any symptoms.  Patient does speak [name] and interpreter was utilized for history and physical examination.  Denies sick contacts or recent travel.  Denies fever chills.  Denies cough, hemoptysis, pleuritic pain, orthopnea, pedal edema or calf tenderness.  Denies rashes or lesions.  Denies headache, lightheadedness/dizziness, rhinorrhea, congestion, sinus pressure/pain, sore throat, ear pain/drainage, abdominal pain, nausea/vomiting, urinary symptoms or changes in bowel movements.  Patient states is not a smoker.  Denies history of asthma or COPD.  Patient states she was seen for similar symptoms about a year ago and states she was given medication and symptoms subsided.  She states unsure of the medication she was given at that time.  Denies history of DVT or PE.  Denies concern for pregnancy.  Denies birth control medication usage.  Denies any significant history of heart disease.    Nursing Notes were all reviewed and agreed with or any disagreements were addressed in the  HPI.    REVIEW OF SYSTEMS    (2-9 systems for level 4, 10 or more for level 5)     Review of Systems   Constitutional: Negative for activity change, appetite change, chills and fever.   Respiratory: Positive for shortness of breath. Negative for cough, chest tightness, wheezing and stridor.    Cardiovascular: Positive for chest pain. Negative for palpitations and leg swelling.   Gastrointestinal: Negative for abdominal pain, constipation, diarrhea, nausea and vomiting.   Genitourinary: Negative for decreased urine volume, difficulty urinating, dysuria, flank pain, frequency, hematuria, menstrual problem, pelvic pain, urgency, vaginal bleeding, vaginal discharge and vaginal pain.   Musculoskeletal: Negative for arthralgias, back pain, myalgias, neck pain and neck stiffness.   Skin: Negative for color change, pallor, rash and wound.   Neurological: Negative for dizziness, light-headedness and headaches.       Positives and Pertinent negatives as per HPI.  Except as noted above in the ROS, all other systems were reviewed and negative.       PAST MEDICAL HISTORY   History reviewed. No pertinent past medical history.      SURGICAL HISTORY   History reviewed. No pertinent surgical history.      CURRENTMEDICATIONS     There are no discharge medications for this patient.        ALLERGIES     Patient has no known allergies.    FAMILYHISTORY     History reviewed. No pertinent family history.  SOCIAL HISTORY       Social History     Tobacco Use   ??? Smoking status: Never Smoker   ??? Smokeless tobacco: Never Used   Substance Use Topics   ??? Alcohol use: Not Currently   ??? Drug use: Never       SCREENINGS             PHYSICAL EXAM    (up to 7 for level 4, 8 or more for level 5)     ED Triage Vitals [03/04/19 2103]   BP Temp Temp src Pulse Resp SpO2 Height Weight   (!) 133/91 97.7 ??F (36.5 ??C) -- 98 19 99 % 5\' 4"  (1.626 m) 125 lb (56.7 kg)       Physical Exam  Constitutional:       General: She is not in acute distress.      Appearance: Normal appearance. She is well-developed. She is not ill-appearing, toxic-appearing or diaphoretic.   HENT:      Head: Normocephalic and atraumatic.      Right Ear: Tympanic membrane, ear canal and external ear normal. There is no impacted cerumen.      Left Ear: Tympanic membrane, ear canal and external ear normal. There is no impacted cerumen.      Nose: Nose normal. No congestion or rhinorrhea.      Mouth/Throat:      Mouth: Mucous membranes are moist.      Pharynx: No oropharyngeal exudate or posterior oropharyngeal erythema.   Eyes:      General:         Right eye: No discharge.         Left eye: No discharge.      Conjunctiva/sclera: Conjunctivae normal.   Neck:      Musculoskeletal: Normal range of motion and neck supple.   Cardiovascular:      Rate and Rhythm: Normal rate and regular rhythm.      Pulses: Normal pulses.      Heart sounds: Normal heart sounds. No murmur. No friction rub. No gallop.       Comments: 2+ radial pulses bilaterally.  No pedal edema.  No calf tenderness.  No JVD.  Pulmonary:      Effort: Pulmonary effort is normal. No respiratory distress.      Breath sounds: Normal breath sounds. No stridor. No wheezing, rhonchi or rales.   Chest:      Chest wall: No tenderness.   Abdominal:      General: Abdomen is flat. Bowel sounds are normal. There is no distension.      Palpations: Abdomen is soft. There is no mass.      Tenderness: There is no abdominal tenderness. There is no right CVA tenderness, left CVA tenderness, guarding or rebound.      Hernia: No hernia is present.   Musculoskeletal: Normal range of motion.   Skin:     General: Skin is warm and dry.      Coloration: Skin is not pale.      Findings: No erythema or rash.   Neurological:      Mental Status: She is alert and oriented to person, place, and time.   Psychiatric:         Behavior: Behavior normal.         DIAGNOSTIC RESULTS   LABS:    Labs Reviewed   CBC WITH AUTO DIFFERENTIAL - Abnormal; Notable for the following  components:  Result Value    Anisocytosis Occasional (*)     Polychromasia Occasional (*)     All other components within normal limits    Narrative:     Performed at:  [facility]  [address]  [place], [place] [address]   Phone [phone]   COMPREHENSIVE METABOLIC PANEL W/ REFLEX TO MG FOR LOW K - Abnormal; Notable for the following components:    Glucose 123 (*)     CREATININE 0.5 (*)     Total Protein 8.4 (*)     All other components within normal limits    Narrative:     Performed at:  [facility]  [address]  [place], [place] [address]   Phone [phone]   TROPONIN    Narrative:     Performed at:  [facility]  [address]  [place], [place] [address]   Phone [phone]   BRAIN NATRIURETIC PEPTIDE    Narrative:     Performed at:  [facility]  [address],  [place], [place] [address]   Phone [phone]   HCG, SERUM, QUALITATIVE    Narrative:     Performed at:  [facility]  [address],  [place], [place] [address]   Phone [phone]       All other labs were within normal range or not returned as of this dictation.    EKG: All EKG's are interpreted by the Emergency Department Physician in the absence of a cardiologist.  Please see their note for interpretation of EKG.      RADIOLOGY:   Non-plain film images such as CT, Ultrasound and MRI are read by the radiologist. Plain radiographic images are visualized and preliminarily interpreted by the ED Provider with the below findings:        Interpretation per the Radiologist below, if available at the time of this note:    XR CHEST STANDARD (2 VW)   Final Result   No acute cardiopulmonary abnormality identified.           Xr Chest Standard (2 Vw)    Result Date: 03/04/2019  EXAMINATION: TWO XRAY VIEWS OF THE CHEST 03/04/2019 9:46 pm COMPARISON: None. HISTORY: ORDERING SYSTEM PROVIDED HISTORY: sob, cp  TECHNOLOGIST PROVIDED HISTORY: Reason for exam:->sob, cp Reason for Exam: sob and chest pain that started one hour ago Acuity: Acute Type of Exam: Initial FINDINGS: No focal lung consolidation.  No pleural effusion or pneumothorax identified. The cardiomediastinal silhouette is normal in size for technique.     No acute cardiopulmonary abnormality identified.           PROCEDURES   Unless otherwise noted below, none     Procedures    CRITICAL CARE TIME   N/A    CONSULTS:  None      EMERGENCY DEPARTMENT COURSE and DIFFERENTIAL DIAGNOSIS/MDM:   Vitals:    Vitals:    03/04/19 2103 03/05/19 0050   BP: (!) 133/91 (!) 134/96   Pulse: 98 96   Resp: 19 20   Temp: 97.7 ??F (36.5 ??C)    SpO2: 99% 100%   Weight: 125 lb (56.7 kg)    Height: 5\' 4"  (1.626 m)        Patient was given the following medications:  Medications - No data to display    Patient is a 23 year old female who presents the ED  plan of shortness of breath.  Patient complaint of shortness of breath and chest pain that subsided prior to arrival.  On exam patient afebrile with stable vital signs.  Nontoxic appearance.  Heart without murmur rub or gallop.  Lungs clear auscultation.  IV established and blood work obtained.  CBC showed normal white count, hemoglobin and platelets.  CMP unremarkable.  Troponin normal.  BNP unremarkable.  Pregnancy negative.  Chest x-ray unremarkable.  EKG showed no acute changes.  Given patient's history and physical examination believe can be safely discharged home with close outpatient follow-up.  Return the ED for new worsening symptoms.  Low suspicion for ACS, PE, dissection, AAA, pneumonia, pneumothorax respiratory stress, GERD, anxiety, CHF, COPD, asthma, surgical abdomen, endocarditis, myocarditis, covid, or other emergent etiology at this time.      FINAL IMPRESSION      1. Shortness of breath          DISPOSITION/PLAN   DISPOSITION Decision To Discharge 03/05/2019 12:34:16 AM      PATIENT REFERREDTO:  [facility]  Emergency Department  [address]  [place] [address]  [phone]  Go to   As needed, If symptoms worsen    [facility]-Services  [phone]          DISCHARGE MEDICATIONS:  There are no discharge medications for this patient.      DISCONTINUED MEDICATIONS:  There are no discharge medications for this patient.             (Please note that portions of this note were completed with a voice recognition program.  Efforts were made to edit the dictations but occasionally words are mis-transcribed.)    Verdon Cummins, PA (electronically signed)          Isabella Stalling, PA  03/05/19 0124

## 2019-03-05 ENCOUNTER — Inpatient Hospital Stay
Admit: 2019-03-05 | Discharge: 2019-03-05 | Disposition: A | Discharge status: Home / Self Care | Attending: Emergency Medicine

## 2019-03-05 LAB — CBC WITH AUTO DIFFERENTIAL
Atypical Lymphocytes Relative: 1 % (ref 0–6)
Basophils %: 0 %
Basophils Absolute: 0 10*3/uL (ref 0.0–0.2)
Eosinophils %: 1 %
Eosinophils Absolute: 0.1 10*3/uL (ref 0.0–0.6)
Hematocrit: 40.9 % (ref 36.0–48.0)
Hemoglobin: 13.8 g/dL (ref 12.0–16.0)
Lymphocytes %: 14 %
Lymphocytes Absolute: 1.4 10*3/uL (ref 1.0–5.1)
MCH: 30.8 pg (ref 26.0–34.0)
MCHC: 33.7 g/dL (ref 31.0–36.0)
MCV: 91.2 fL (ref 80.0–100.0)
MPV: 9 fL (ref 5.0–10.5)
Monocytes %: 5 %
Monocytes Absolute: 0.5 10*3/uL (ref 0.0–1.3)
Neutrophils %: 79 %
Neutrophils Absolute: 7.5 10*3/uL (ref 1.7–7.7)
PLATELET SLIDE REVIEW: ADEQUATE
Platelets: 260 10*3/uL (ref 135–450)
RBC: 4.49 M/uL (ref 4.00–5.20)
RDW: 14.2 % (ref 12.4–15.4)
WBC: 9.5 10*3/uL (ref 4.0–11.0)

## 2019-03-05 LAB — COMPREHENSIVE METABOLIC PANEL W/ REFLEX TO MG FOR LOW K
ALT: 30 U/L (ref 10–40)
AST: 32 U/L (ref 15–37)
Albumin/Globulin Ratio: 1.4 (ref 1.1–2.2)
Albumin: 4.9 g/dL (ref 3.4–5.0)
Alkaline Phosphatase: 99 U/L (ref 40–129)
Anion Gap: 15 (ref 3–16)
BUN: 9 mg/dL (ref 7–20)
CO2: 22 mmol/L (ref 21–32)
Calcium: 10.3 mg/dL (ref 8.3–10.6)
Chloride: 101 mmol/L (ref 99–110)
Creatinine: 0.5 mg/dL — ABNORMAL LOW (ref 0.6–1.1)
GFR African American: >60 (ref >60)
GFR Non-African American: >60 (ref >60)
Globulin: 3.5 g/dL
Glucose: 123 mg/dL — ABNORMAL HIGH (ref 70–99)
Potassium reflex Magnesium: 3.9 mmol/L (ref 3.5–5.1)
Sodium: 138 mmol/L (ref 136–145)
Total Bilirubin: 0.5 mg/dL (ref 0.0–1.0)
Total Protein: 8.4 g/dL — ABNORMAL HIGH (ref 6.4–8.2)

## 2019-03-05 LAB — TROPONIN: Troponin: <0.01 ng/mL (ref <0.01)

## 2019-03-05 LAB — BRAIN NATRIURETIC PEPTIDE: Pro-BNP: 6 pg/mL (ref 0–124)

## 2019-03-05 LAB — HCG, SERUM, QUALITATIVE: hCG Qual: NEGATIVE

## 2019-03-05 NOTE — ED Notes (Signed)
Pt wanting to leave - has to take care of child. Dr. Cleotis Lema at bedside talking with pt at present.     Pamala Hurry, RN  03/05/19 [phone]

## 2019-03-05 NOTE — ED Notes (Signed)
Pt c/o chest pain with inspiration x tonight. Respirations even and unlabored. SR noted on monitor with ST analysis on. No cough or fever noted. Saline lock inserted with blood drawn and sent to lab. Call bell in reach.     Pamala Hurry, RN  03/05/19 0002

## 2019-03-06 LAB — EKG 12-LEAD
Atrial Rate: 104 {beats}/min
P Axis: 30 degrees
P-R Interval: 136 ms
Q-T Interval: 342 ms
QRS Duration: 78 ms
QTc Calculation (Bazett): 449 ms
R Axis: 3 degrees
T Axis: 39 degrees
Ventricular Rate: 104 {beats}/min

## 2019-04-26 DIAGNOSIS — H1031 Unspecified acute conjunctivitis, right eye: Secondary | ICD-10-CM

## 2019-04-26 NOTE — ED Provider Notes (Signed)
[facility] EMERGENCY DEPARTMENT  EMERGENCY DEPARTMENT ENCOUNTER        Pt Name: Olivia Sawyer  MRN: [id number]  Birthdate 1995-12-19  Date of evaluation: 04/26/2019  Provider: Eduard Roux  PCP: No primary care provider on file.    Evaluation by APP.  My supervising physician was available for consultation.    CHIEF COMPLAINT       Chief Complaint   Patient presents with   . Allergies     pt in with running eyes/ red, and runny nose x 2 days        HISTORY OF PRESENT ILLNESS   (Location, Timing/Onset, Context/Setting, Quality, Duration, Modifying Factors, Severity, Associated Signs and Symptoms)  Note limiting factors.     Olivia Sawyer is a 23 y.o. female patient presents emergency department for evaluation of runny nose and itchy eyes for the past 2 days.  Patient has not tried any over-the-counter medications for her symptoms.  Patient states she has tear-like discharge from her right eye.  There is no pain but intense itching.  No purulent discharge per the patient.  Runny nose without congestion.  No cough or fever.  No shortness of breath or chest pain.  No abdominal pain nausea vomiting or diarrhea.  Patient has no history of seasonal allergies.  She denies any difficulty swallowing or breathing.  She denies any sore throat.      Nursing Notes were all reviewed and agreed with or any disagreements were addressed in the HPI.    REVIEW OF SYSTEMS    (2-9 systems for level 4, 10 or more for level 5)     Review of Systems   Constitutional: Negative for fatigue and fever.   HENT: Positive for rhinorrhea.    Eyes: Positive for redness and itching. Negative for visual disturbance.   Respiratory: Negative for shortness of breath.    Cardiovascular: Negative for chest pain.   Gastrointestinal: Negative for abdominal pain, nausea and vomiting.       Positives and Pertinent negatives as per HPI.  Except as noted above in the ROS, all other systems were reviewed and negative.       PAST MEDICAL HISTORY    History reviewed. No pertinent past medical history.      SURGICAL HISTORY   History reviewed. No pertinent surgical history.      CURRENTMEDICATIONS       Discharge Medication List as of 04/26/2019 11:51 PM            ALLERGIES     Patient has no known allergies.    FAMILYHISTORY     History reviewed. No pertinent family history.       SOCIAL HISTORY       Social History     Tobacco Use   . Smoking status: Never Smoker   . Smokeless tobacco: Never Used   Substance Use Topics   . Alcohol use: Not Currently   . Drug use: Never       SCREENINGS             PHYSICAL EXAM    (up to 7 for level 4, 8 or more for level 5)     ED Triage Vitals [04/26/19 2224]   BP Temp Temp src Pulse Resp SpO2 Height Weight   119/89 98.1 F (36.7 C) -- 97 15 100 % -- --       Physical Exam  Vitals signs and nursing note reviewed.   Constitutional:  General: She is not in acute distress.     Appearance: Normal appearance. She is well-developed. She is not ill-appearing, toxic-appearing or diaphoretic.   HENT:      Head: Normocephalic and atraumatic.      Right Ear: Tympanic membrane, ear canal and external ear normal.      Left Ear: Tympanic membrane, ear canal and external ear normal.      Nose: Nose normal.      Mouth/Throat:      Mouth: Mucous membranes are moist.      Pharynx: Oropharynx is clear.   Eyes:      General: Lids are normal. Vision grossly intact. No scleral icterus.        Right eye: No discharge.         Left eye: No discharge.      Extraocular Movements: Extraocular movements intact.      Right eye: Normal extraocular motion and no nystagmus.      Left eye: Normal extraocular motion and no nystagmus.      Conjunctiva/sclera:      Right eye: Right conjunctiva is injected. No chemosis, exudate or hemorrhage.     Left eye: Left conjunctiva is not injected. No chemosis, exudate or hemorrhage.     Pupils: Pupils are equal, round, and reactive to light.      Comments: Cobblestoning noted of conjunctiva, right eye slightly  more injected sclera than left.  Clear watery discharge noted.  No chemosis.  No purulent discharge.   Neck:      Musculoskeletal: Normal range of motion and neck supple.   Cardiovascular:      Rate and Rhythm: Normal rate and regular rhythm.      Heart sounds: Normal heart sounds. No murmur. No gallop.    Pulmonary:      Effort: Pulmonary effort is normal. No respiratory distress.      Breath sounds: Normal breath sounds. No wheezing or rales.   Musculoskeletal: Normal range of motion.   Skin:     General: Skin is warm and dry.      Capillary Refill: Capillary refill takes less than 2 seconds.      Coloration: Skin is not pale.   Neurological:      General: No focal deficit present.      Mental Status: She is alert and oriented to person, place, and time.   Psychiatric:         Mood and Affect: Mood normal.         Behavior: Behavior normal.         DIAGNOSTIC RESULTS   LABS:    Labs Reviewed - No data to display    All other labs were within normal range or not returned as of this dictation.    EKG: All EKG's are interpreted by the Emergency Department Physician in the absence of a cardiologist.  Please see their note for interpretation of EKG.      RADIOLOGY:   Non-plain film images such as CT, Ultrasound and MRI are read by the radiologist. Plain radiographic images are visualized and preliminarily interpreted by the ED Provider with the below findings:        Interpretation per the Radiologist below, if available at the time of this note:    No orders to display     No results found.        PROCEDURES   Unless otherwise noted below, none     Procedures    CRITICAL  CARE TIME   N/A    CONSULTS:  None      EMERGENCY DEPARTMENT COURSE and DIFFERENTIAL DIAGNOSIS/MDM:   Vitals:    Vitals:    04/26/19 2224   BP: 119/89   Pulse: 97   Resp: 15   Temp: 98.1 F (36.7 C)   SpO2: 100%       Patient was given the following medications:  Medications - No data to display    Patient presents to the emergency department for  evaluation of runny nose and itchy eyes.  She denies any pain or trauma to the eye.  Patient is alert and oriented no acute distress.  Vitals are stable and she is afebrile.  Patient has injection to right sclera more on the right than the left.  Cobblestoning to bilateral conjunctiva.  Clear watery discharge noted from right eye predominantly.  No purulent discharge.  No chemosis.  Pupils equal and reactive to light.  EOMs intact without pain or entrapment.  No periorbital redness or swelling.  Patient does not have any foreign body sensation.  She denies any pain to the eye.  Vision is grossly intact.  She denies any headache.  Patient will be treated with Zaditor and Claritin.  Encouraged to return for any worsening symptoms.    I estimate there is LOW risk for FOREIGN BODY, HYPHEMA, SUBCONJUNCTIVAL HEMORRHAGE, PERIORBITAL CELLULITIS, ORBITAL CELLULITIS, CORNEAL ULCER OR ABRASION,  PENETRATING GLOBE INJURY, CENTRAL RETINAL ARTERY OCCLUSION, ACUTE GLAUCOMA, DENDRITIC PATTERN, RETINAL VEIN THROMBOSIS, OR HERPETIC KERATITIS, thus I consider the discharge disposition reasonable. Patient is given strict return precautions to return with worsening pain, vision changes, neck stiffness or fever, or any other concerns.      FINAL IMPRESSION      1. Acute conjunctivitis of right eye, unspecified acute conjunctivitis type          DISPOSITION/PLAN   DISPOSITION Decision To Discharge 04/26/2019 11:14:34 PM      PATIENT REFERREDTO:  [facility] Emergency Department  [address]  [place] [address]  [phone]    If symptoms worsen      DISCHARGE MEDICATIONS:  Discharge Medication List as of 04/26/2019 11:51 PM      START taking these medications    Details   ketotifen (ZADITOR) 0.025 % ophthalmic solution Place 1 drop into the right eye 2 times daily for 10 days, Disp-1 mL, R-0Print      loratadine (CLARITIN) 10 MG tablet Take 1 tablet by mouth daily, Disp-30 tablet, R-0Print             DISCONTINUED  MEDICATIONS:  Discharge Medication List as of 04/26/2019 11:51 PM                 (Please note that portions of this note were completed with a voice recognition program.  Efforts were made to edit the dictations but occasionally words are mis-transcribed.)    Jannett Celestine, PA-C (electronically signed)            Jannett Celestine, PA-C  04/27/19 [id number]

## 2019-04-27 ENCOUNTER — Inpatient Hospital Stay: Admit: 2019-04-27 | Discharge: 2019-04-27 | Disposition: A | Discharge status: Home / Self Care

## 2019-04-27 MED ORDER — LORATADINE 10 MG PO TABS
10 MG | ORAL_TABLET | Freq: Every day | ORAL | 0 refills | Status: AC
Start: 2019-04-27 — End: ?

## 2019-04-27 MED ORDER — KETOTIFEN FUMARATE 0.025 % OP SOLN
0.025 % | Freq: Two times a day (BID) | OPHTHALMIC | 0 refills | Status: AC
Start: 2019-04-27 — End: 2019-05-06

## 2019-05-04 ENCOUNTER — Emergency Department: Admit: 2019-05-04

## 2019-05-04 ENCOUNTER — Inpatient Hospital Stay
Admit: 2019-05-04 | Discharge: 2019-05-04 | Disposition: A | Discharge status: Home / Self Care | Attending: Emergency Medicine

## 2019-05-04 DIAGNOSIS — R079 Chest pain, unspecified: Secondary | ICD-10-CM

## 2019-05-04 LAB — CBC WITH AUTO DIFFERENTIAL
Basophils %: 0.5 %
Basophils Absolute: 0 10*3/uL (ref 0.0–0.2)
Eosinophils %: 4.8 %
Eosinophils Absolute: 0.3 10*3/uL (ref 0.0–0.6)
Hematocrit: 41.2 % (ref 36.0–48.0)
Hemoglobin: 13.6 g/dL (ref 12.0–16.0)
Lymphocytes %: 31.4 %
Lymphocytes Absolute: 2 10*3/uL (ref 1.0–5.1)
MCH: 29.7 pg (ref 26.0–34.0)
MCHC: 32.9 g/dL (ref 31.0–36.0)
MCV: 90.4 fL (ref 80.0–100.0)
MPV: 9.9 fL (ref 5.0–10.5)
Monocytes %: 6.8 %
Monocytes Absolute: 0.4 10*3/uL (ref 0.0–1.3)
Neutrophils %: 56.5 %
Neutrophils Absolute: 3.5 10*3/uL (ref 1.7–7.7)
Platelets: 230 10*3/uL (ref 135–450)
RBC: 4.56 M/uL (ref 4.00–5.20)
RDW: 13.2 % (ref 12.4–15.4)
WBC: 6.2 10*3/uL (ref 4.0–11.0)

## 2019-05-04 LAB — EKG 12-LEAD
Atrial Rate: 90 {beats}/min
P Axis: 53 degrees
P-R Interval: 146 ms
Q-T Interval: 348 ms
QRS Duration: 84 ms
QTc Calculation (Bazett): 425 ms
R Axis: 31 degrees
T Axis: 63 degrees
Ventricular Rate: 90 {beats}/min

## 2019-05-04 LAB — BASIC METABOLIC PANEL W/ REFLEX TO MG FOR LOW K
Anion Gap: 10 (ref 3–16)
BUN: 5 mg/dL — ABNORMAL LOW (ref 7–20)
CO2: 21 mmol/L (ref 21–32)
Calcium: 9.6 mg/dL (ref 8.3–10.6)
Chloride: 107 mmol/L (ref 99–110)
Creatinine: <0.5 mg/dL — ABNORMAL LOW (ref 0.6–1.1)
GFR African American: >60 (ref >60)
GFR Non-African American: >60 (ref >60)
Glucose: 94 mg/dL (ref 70–99)
Potassium reflex Magnesium: 3.8 mmol/L (ref 3.5–5.1)
Sodium: 138 mmol/L (ref 136–145)

## 2019-05-04 LAB — TROPONIN: Troponin: <0.01 ng/mL (ref <0.01)

## 2019-05-04 LAB — HCG, SERUM, QUALITATIVE: hCG Qual: NEGATIVE

## 2019-05-04 MED ORDER — KETOROLAC TROMETHAMINE 30 MG/ML IJ SOLN
30 MG/ML | Freq: Once | INTRAMUSCULAR | Status: AC
Start: 2019-05-04 — End: 2019-05-04
  Administered 2019-05-04: 08:00:00 30 mg via INTRAMUSCULAR

## 2019-05-04 MED ORDER — NAPROXEN 250 MG PO TABS
250 MG | ORAL_TABLET | Freq: Two times a day (BID) | ORAL | 0 refills | Status: AC
Start: 2019-05-04 — End: ?

## 2019-05-04 MED FILL — KETOROLAC TROMETHAMINE 30 MG/ML IJ SOLN: 30 mg/mL | INTRAMUSCULAR | Qty: 1

## 2019-05-04 NOTE — Discharge Instructions (Signed)
Please return if you have any new, worsening, or concerning symptoms.     For every emergency room visit, we recommend you contact your primary care physician as soon as possible to arrange appropriate follow up. You should discuss this visit and any labwork/imaging/exam findings that were obtained, as there may be incidental findings that may need to be addressed. If you are ever unable to arrange follow up, you should return to the emergency room. Until you see your doctor, you should avoid strenuous/heavy activity.

## 2019-05-04 NOTE — ED Provider Notes (Signed)
[facility] HEALTH [facility] EMERGENCY DEPARTMENT PROVIDER NOTE    Patient Identification  Pt Name: Olivia Sawyer  MRN: [id number]  Birthdate 04/10/1996  Date of evaluation: 05/04/2019  Provider: Dante Gang, MD  PCP: No primary care provider on file.    Chief Complaint  Shortness of Breath (Pt in due to SOB, husband states that pt feels like she cant breathe and feels like "she is going to die." PT states she might be pregnant because she has not had her period in two months.)      HPI  History provided by patient   This is a 23 y.o. female who presents to the ED for shortness of breath and right-sided chest discomfort.  Began today.  Does not radiate.  Feels anxious.  Has had this at least 4 other times in the past.  Attributed to an old shoulder injury.  No unilateral leg pain or swelling.  No fever or chills.  No cough.  No infectious contacts.Marland Kitchen     ROS  10 systems reviewed, pertinent positives/negatives per HPI otherwise noted to be negative.    I have reviewed the following nursing documentation:  Allergies: Patient has no known allergies.    Past medical history: History reviewed. No pertinent past medical history.  Past surgical history: History reviewed. No pertinent surgical history.    Home medications:   Previous Medications    KETOTIFEN (ZADITOR) 0.025 % OPHTHALMIC SOLUTION    Place 1 drop into the right eye 2 times daily for 10 days    LORATADINE (CLARITIN) 10 MG TABLET    Take 1 tablet by mouth daily       Social history:  reports that she has never smoked. She has never used smokeless tobacco. She reports previous alcohol use. She reports that she does not use drugs.    Family history:  History reviewed. No pertinent family history.      Exam  ED Triage Vitals [05/04/19 0303]   BP Temp Temp Source Pulse Resp SpO2 Height Weight   (!) 129/94 98 F (36.7 C) Oral 92 16 98 % -- 133 lb (60.3 kg)     Nursing note and vitals reviewed.  Constitutional: In no acute distress  HENT:      Head: Normocephalic      Ears:  External ears normal.      Nose: Nose normal.     Mouth: Membrane mucosa moist   Eyes: No discharge.   Neck: Supple. Trachea midline.   Cardiovascular: Regular rate. Warm extremities  Pulmonary/Chest: Effort normal. No respiratory distress. CTAB.   Abdominal: Soft. No distension. Nontender  GU: Deferred  Rectal: Deferred   Musculoskeletal: Moves all extremities. No gross deformity.  Neurological: Alert and oriented. Face symmetric. Speech is clear.  Skin: Warm and dry. No rash.  Psychiatric: Normal mood and affect. Behavior is normal.    Procedures      EKG  The Ekg interpreted by me in the absence of a cardiologist shows.  Normal sinus rhythm.   No specific ST-T wave changes appreciated.  No evidence of acute ischemia.         Radiology  XR CHEST PORTABLE   Final Result   No acute cardiopulmonary disease.             Labs      Screenings           MDM and ED Course  Patient is a 23 year old woman with no significant past medical history who presents  with right-sided chest discomfort and shortness of breath.  Has had this multiple times in the past and attributed to an old injury.  Unlikely pulmonary embolism given lack of tachycardia, tachypnea, hypoxemia, unilateral DVT symptoms or history of hypercoagulable state.  Will check chest x-ray to assess for infection.  Unlikely ACS given young age and unremarkable EKG.  (EMP MDM)    Patient was reassessed. They are feeling well. Labs unremarkable. Objectively they appear to be in no acute distress. Discharge instructions, follow up instructions, and return precautions were discussed with the patient and any available family. All questions were answered.    @EMPNOTECCTIME @    Final Impression  1. Chest pain, unspecified type        Blood pressure 115/89, pulse 96, temperature 98 F (36.7 C), temperature source Oral, resp. rate 18, weight 133 lb (60.3 kg), last menstrual period 02/16/2019, SpO2 98 %.     Disposition:  DISPOSITION Decision To Discharge 05/04/2019  04:43:48 AM      Patient Referrals:  [facility]  [phone]          Discharge Medications:  New Prescriptions    NAPROXEN (NAPROSYN) 250 MG TABLET    Take 1 tablet by mouth 2 times daily (with meals)       Discontinued Medications:  Discontinued Medications    No medications on file       This chart was generated using the Dragon dictation system. I created this record but it may contain dictation errors given the limitations of this technology.        Dante Gang, MD  05/04/19 [phone]

## 2019-05-05 NOTE — Care Coordination-Inpatient (Signed)
Attempted to call. No longer in service.

## 2019-07-04 ENCOUNTER — Emergency Department (HOSPITAL_COMMUNITY)
Admission: EM | Admit: 2019-07-04 | Discharge: 2019-07-04 | Discharge status: Absent without leave | Payer: Medicaid Other

## 2019-08-14 ENCOUNTER — Other Ambulatory Visit: Payer: Self-pay

## 2019-08-14 ENCOUNTER — Ambulatory Visit (HOSPITAL_COMMUNITY)
Admission: EM | Admit: 2019-08-14 | Discharge: 2019-08-14 | Disposition: A | Discharge status: Home / Self Care | Payer: Medicaid Other | Attending: Family Medicine | Admitting: Family Medicine

## 2019-08-14 ENCOUNTER — Encounter (HOSPITAL_COMMUNITY): Payer: Self-pay

## 2019-08-14 DIAGNOSIS — N912 Amenorrhea, unspecified: Secondary | ICD-10-CM

## 2019-08-14 DIAGNOSIS — Z3201 Encounter for pregnancy test, result positive: Secondary | ICD-10-CM | POA: Diagnosis not present

## 2019-08-14 DIAGNOSIS — Z3490 Encounter for supervision of normal pregnancy, unspecified, unspecified trimester: Secondary | ICD-10-CM

## 2019-08-14 LAB — POCT PREGNANCY, URINE: Preg Test, Ur: POSITIVE — AB

## 2019-08-14 MED ORDER — PRENATAL MULTIVITAMIN CH: 1.0000 | ORAL_TABLET | Freq: Every day | ORAL | 0 refills | Status: DC
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
## 2019-08-14 NOTE — ED Triage Notes (Signed)
Patient presents to Urgent Care with complaints of irregular periods since she last her her baby in December of last year. Patient reports she has had ZERO periods since May, when she first had a pregnancy test. Pt also would like her right big toe assessed because a mouse bit her toe a month ago. Patient endorses LLQ abdominal pain, intermittently. [name] interpreter utilized during triage.

## 2019-08-14 NOTE — Discharge Instructions (Addendum)
Make sure that you eat well Take your vitamin every day No alcohol, no cigarette smoking Call to get an OB/GYN appointment.  Make a call tomorrow so that you can be seen soon

## 2019-09-27 ENCOUNTER — Emergency Department (HOSPITAL_COMMUNITY)
Admission: EM | Admit: 2019-09-27 | Discharge: 2019-09-27 | Disposition: A | Discharge status: Home / Self Care | Payer: Medicaid Other | Attending: Emergency Medicine | Admitting: Emergency Medicine

## 2019-09-27 ENCOUNTER — Other Ambulatory Visit: Payer: Self-pay

## 2019-09-27 DIAGNOSIS — Z20828 Contact with and (suspected) exposure to other viral communicable diseases: Secondary | ICD-10-CM | POA: Insufficient documentation

## 2019-09-27 DIAGNOSIS — Z331 Pregnant state, incidental: Secondary | ICD-10-CM | POA: Diagnosis not present

## 2019-09-27 DIAGNOSIS — R05 Cough: Secondary | ICD-10-CM | POA: Insufficient documentation

## 2019-09-27 DIAGNOSIS — R0981 Nasal congestion: Secondary | ICD-10-CM | POA: Diagnosis present

## 2019-09-27 LAB — SARS CORONAVIRUS 2 (TAT 6-24 HRS): SARS Coronavirus 2: NEGATIVE

## 2019-09-27 NOTE — ED Notes (Signed)
Patient verbalizes understanding of discharge instructions. Opportunity for questioning and answers were provided. Armband removed by staff, pt discharged from ED.  

## 2019-09-27 NOTE — Discharge Instructions (Addendum)
Thank you for allowing [place] to care for you today.   Please return to the emergency department if you have any new or worsening symptoms.  You were tested for covid-19 today. The result should be available in 24 hours. You should self quarantine to until you have your test result. You will be called if the test is positive. If it is negative you can see the result on MyChart.   Medications- You can take medications to help treat your symptoms: -Tylenol for fever and body aches. Please take as prescribed on the bottle. -Continue taking pre natal vitamins. -Do not take ibuprofen, advil, or motrin as they are not safe in pregnancy  -You should follow up with your ob/gyn doctor to discuss medications that are safe to take in pregnancy. Please call today to schedule follow up appointment   I hope you feel better soon

## 2019-09-27 NOTE — ED Provider Notes (Signed)
[facility] EM[facility]GENC[facility] Provider Note   CSN: [id number] Arrival date & time: 09/27/19  1059     History   Chief Complaint Chief Complaint  Patient presents with   Cough   Nasal Congestion    HPI Janet [name] is a 23 y.o. female with past medical history significant for major depressive disorder presents emergency department today with chief complaint of cough and nasal congestion x2 days. Pt is 5 month pregnant and [facility], currently taking prenatal vitamins. Previous pregnancies uneventful.  She states her cough is nonproductive. She has not taken any medications for her symptoms prior to arrival. She is here requesting covid testing and note to return to work.  She denies fever, chill, shortness of breath, sore throat, sinus pressure, chest pain, abdominal pain, nausea, urinary symptoms, sick contacts. No known contact with anyone positive for Covid-19. History provided by patient with additional history obtained from chart review.     Past Medical History:  Diagnosis Date   Anemia     Patient Active Problem List   Diagnosis Date Noted   Oligohydramnios 11/14/2018   Self-inflicted laceration of wrist [facility](HCC)    MDD (major depressive disorder), single episode, severe (HCC) 11/22/2017   Pregnancy 07/16/2015   [redacted] weeks gestation of pregnancy    Encounter for fetal anatomic survey     No past surgical history on file.   OB History    Gravida  3   Para  3   Term  3   Preterm  0   AB  0   Living  3     SAB  0   TAB  0   Ectopic  0   Multiple  0   Live Births  3            Home Medications    Prior to Admission medications   Medication Sig Start Date End Date Taking? Authorizing Provider  ferrous sulfate 325 (65 FE) MG tablet Take 325 mg by mouth daily with breakfast.    [provider]  Prenatal Vit-Fe Fumarate-FA (PRENATAL MULTIVITAMIN) TABS tablet Take 1 tablet by  mouth daily at 12 noon. 08/14/19   Eustace MooreNelson, Yvonne Sue, MD    Family History Family History  Problem Relation Age of Onset   Healthy Mother    Healthy Father     Social History Social History   Tobacco Use   Smoking status: Never Smoker   Smokeless tobacco: Never Used  Substance Use Topics   Alcohol use: No   Drug use: No     Allergies   Patient has no known allergies.   Review of Systems Review of Systems  Constitutional: Negative for appetite change, chills, fatigue and fever.  HENT: Positive for congestion. Negative for drooling, ear pain, facial swelling, nosebleeds, rhinorrhea, sinus pressure, sinus pain, sneezing, sore throat, trouble swallowing and voice change.   Eyes: Negative for pain and discharge.  Respiratory: Positive for cough. Negative for shortness of breath.   Cardiovascular: Negative for chest pain.  Gastrointestinal: Negative for abdominal pain, nausea and vomiting.  Genitourinary: Negative for dysuria, frequency, hematuria and pelvic pain.  Musculoskeletal: Negative for neck pain.  Skin: Negative for wound.  Neurological: Negative for headaches.     Physical Exam Updated Vital Signs BP 119/77 (BP Location: Left Arm)    Pulse 87    Temp 98.4 F (36.9 C) (Oral)    Resp 18    Ht  5\' 1"  (1.549 m)    Wt 45.4 kg    SpO2 100%    BMI 18.89 kg/m   Physical Exam Vitals signs and nursing note reviewed.  Constitutional:      Appearance: She is well-developed. She is not ill-appearing or toxic-appearing.  HENT:     Head: Normocephalic and atraumatic.     Comments: No sinus or temporal tenderness.    Right Ear: Tympanic membrane and external ear normal.     Left Ear: Tympanic membrane and external ear normal.     Nose: Nose normal.     Mouth/Throat:     Comments: No erythema to oropharynx, no edema, no exudate, no tonsillar swelling, voice normal, neck supple without lymphadenopathy  Eyes:     General: No scleral icterus.       Right eye: No  discharge.        Left eye: No discharge.     Conjunctiva/sclera: Conjunctivae normal.  Neck:     Musculoskeletal: Normal range of motion.     Vascular: No JVD.  Cardiovascular:     Rate and Rhythm: Normal rate and regular rhythm.     Pulses: Normal pulses.     Heart sounds: Normal heart sounds.  Pulmonary:     Effort: Pulmonary effort is normal.     Breath sounds: Normal breath sounds.  Abdominal:     General: There is no distension.     Tenderness: There is no abdominal tenderness.     Comments: Gravid. No abdominal tenderness on exam.  Musculoskeletal: Normal range of motion.  Skin:    General: Skin is warm and dry.  Neurological:     Mental Status: She is oriented to person, place, and time.     GCS: GCS eye subscore is 4. GCS verbal subscore is 5. GCS motor subscore is 6.     Comments: Fluent speech, no facial droop.  Psychiatric:        Behavior: Behavior normal.      ED Treatments / Results  Labs (all labs ordered are listed, but only abnormal results are displayed) Labs Reviewed  SARS CORONAVIRUS 2 (TAT 6-24 HRS)    EKG None  Radiology No results found.  Procedures Procedures (including critical care time)  Medications Ordered in ED Medications - No data to display   Initial Impression / Assessment and Plan / ED Course  I have reviewed the triage vital signs and the nursing notes.  Pertinent labs & imaging results that were available during my care of the patient were reviewed by me and considered in my medical decision making (see chart for details).  I have reviewed patient's EMR to obtain pertinent PMH to assist in MDM.  Symptoms and exam most suggestive of uncomplicated viral illness. DDX incluldes viral URI/LRI, COVID-19.  No travel. No known exposures to confirmed COVID-19.    Exam is benign.  Normal WOB. No fever, tachypnea, tachycardia, hypoxemia. Lungs are CTAB. I do not think that a CXR is indicated at this time as VS are WNL, there are no  signs of consolidation on auscultation and there is no hypoxia, increased WOB or other concerning features to exam. No significant h/o immunocompromise. Doubt bacterial bronchitis or pneumonia.  No signs or symptoms to suggest strep pharyngitis.  No clinical signs of severe illness, dehydration, to warrant further emergent work up in ER.   Given reassuring physical exam, symptoms, will discharge with symptom treatment of tylenol given pregnancy. Covid test performed and is pending.  Self-isolation instructions discussed. Pt was given home self-isolation instructions and instructions for family members.  Pt understands signs and symptoms that would warrant return to ED.  Pt comfortable and agreeable with POC. Recommend close follow up with OB.  Flonnie Wierman was evaluated in Emergency Department on 09/27/2019 for the symptoms described in the history of present illness. She was evaluated in the context of the global COVID-19 pandemic, which necessitated consideration that the patient might be at risk for infection with the SARS-CoV-2 virus that causes COVID-19. Institutional protocols and algorithms that pertain to the evaluation of patients at risk for COVID-19 are in a state of rapid change based on information released by regulatory bodies including the CDC and federal and state organizations. These policies and algorithms were followed during the patient's care in the ED.     Final Clinical Impressions(s) / ED Diagnoses   Final diagnoses:  Nasal congestion    ED Discharge Orders    None       Kathyrn Lass 09/27/19 1806    Benjiman Core, MD 10/02/19 [phone]

## 2019-09-27 NOTE — ED Triage Notes (Addendum)
Pt presents with cough and nasal congestion x2 days. Pt requesting dr'd note for work

## 2019-12-14 NOTE — L&D Delivery Note (Addendum)
OB/GYN Faculty Practice Delivery Note  Janet [name] is a 24 y.o. [id number] s/p SVD at [redacted]w[redacted]d. She was admitted for SOL.   ROM: 2h 98m with clear fluid GBS Status: Negative/-- (01/27 0000) Maximum Maternal Temperature: 99.71F  Labor Progress: . Patient presented to L&D for SOL. Initial SVE: 4/70/-2. AROM at 6cm. She then progressed to complete.   Delivery Date/Time: 01/09/20 @ 2144 Delivery: Called to room and patient was complete and pushing. Head position was ROA and delivered with ease over the perineum. No nuchal cord present. Shoulder and body delivered in usual fashion. Infant with spontaneous cry, placed on mother's abdomen, dried and stimulated. Cord clamped x 2 after 1-minute delay, and cut by Dr. Lily Peer. Cord blood drawn. Placenta delivered spontaneously with gentle cord traction. Fundus firm with massage and pitocin started. Labia, perineum, vagina, and cervix inspected and was clear of lacerations. Shortly after she expressed several clots and a few small clots were cleared from the LUS. She was given one dose of IM methergine with excellent tone noted and no further clots. Subsequently called back one additional time and pulled a small amount of membranes from the LUS, given 1,000 mcg PR with good effect.  Baby Weight: pending  Cord: central insertion, 3 vessel Placenta: Sent to L&D Complications: None Lacerations: None EBL: 150cc Analgesia: Epidural   Infant: APGAR (1 MIN): 9   APGAR (5 MINS): 9   Weight: 3465 grams  Lorri Frederick, DO, PGY-1 OBGYN Faculty Teaching Service  01/09/2020, 10:00 PM    OB FELLOW ATTESTATION  I was present, gloved, and supervising throughout delivery and have edited the above note to reflect any changes or updates.  Zack Seal, MD/[facility] OB Fellow  01/09/2020, 11:00 PM

## 2020-01-09 ENCOUNTER — Encounter (HOSPITAL_COMMUNITY): Payer: Self-pay | Admitting: *Deleted

## 2020-01-09 ENCOUNTER — Inpatient Hospital Stay (HOSPITAL_COMMUNITY)
Admission: AD | Admit: 2020-01-09 | Discharge: 2020-01-11 | DRG: 807 | Disposition: A | Discharge status: Home / Self Care | Payer: Medicaid Other | Attending: Obstetrics & Gynecology | Admitting: Obstetrics & Gynecology

## 2020-01-09 ENCOUNTER — Inpatient Hospital Stay (HOSPITAL_COMMUNITY): Payer: Medicaid Other | Admitting: Anesthesiology

## 2020-01-09 ENCOUNTER — Other Ambulatory Visit: Payer: Self-pay

## 2020-01-09 DIAGNOSIS — O9902 Anemia complicating childbirth: Secondary | ICD-10-CM | POA: Diagnosis present

## 2020-01-09 DIAGNOSIS — Z20822 Contact with and (suspected) exposure to covid-19: Secondary | ICD-10-CM | POA: Diagnosis present

## 2020-01-09 DIAGNOSIS — O321XX0 Maternal care for breech presentation, not applicable or unspecified: Secondary | ICD-10-CM | POA: Diagnosis present

## 2020-01-09 DIAGNOSIS — D649 Anemia, unspecified: Secondary | ICD-10-CM | POA: Diagnosis present

## 2020-01-09 DIAGNOSIS — Z30017 Encounter for initial prescription of implantable subdermal contraceptive: Secondary | ICD-10-CM

## 2020-01-09 DIAGNOSIS — O99824 Streptococcus B carrier state complicating childbirth: Secondary | ICD-10-CM | POA: Diagnosis not present

## 2020-01-09 DIAGNOSIS — O26893 Other specified pregnancy related conditions, third trimester: Secondary | ICD-10-CM | POA: Diagnosis present

## 2020-01-09 DIAGNOSIS — Z3A38 38 weeks gestation of pregnancy: Secondary | ICD-10-CM | POA: Diagnosis not present

## 2020-01-09 HISTORY — DX: Depression, unspecified: F32.A

## 2020-01-09 LAB — OB RESULTS CONSOLE RPR: RPR: NONREACTIVE

## 2020-01-09 LAB — TYPE AND SCREEN
ABO/RH(D): A POS
Antibody Screen: NEGATIVE

## 2020-01-09 LAB — OB RESULTS CONSOLE HEPATITIS B SURFACE ANTIGEN: Hepatitis B Surface Ag: NEGATIVE

## 2020-01-09 LAB — CBC
HCT: 31.2 % — ABNORMAL LOW (ref 36.0–46.0)
Hemoglobin: 10 g/dL — ABNORMAL LOW (ref 12.0–15.0)
MCH: 29.7 pg (ref 26.0–34.0)
MCHC: 32.1 g/dL (ref 30.0–36.0)
MCV: 92.6 fL (ref 80.0–100.0)
Platelets: 177 10*3/uL (ref 150–400)
RBC: 3.37 MIL/uL — ABNORMAL LOW (ref 3.87–5.11)
RDW: 15.7 % — ABNORMAL HIGH (ref 11.5–15.5)
WBC: 10.1 10*3/uL (ref 4.0–10.5)
nRBC: 0 % (ref 0.0–0.2)

## 2020-01-09 LAB — RESPIRATORY PANEL BY RT PCR (FLU A&B, COVID)
Influenza A by PCR: NEGATIVE
Influenza B by PCR: NEGATIVE
SARS Coronavirus 2 by RT PCR: NEGATIVE

## 2020-01-09 LAB — OB RESULTS CONSOLE GC/CHLAMYDIA
Chlamydia: NEGATIVE
Gonorrhea: NEGATIVE

## 2020-01-09 LAB — OB RESULTS CONSOLE GBS: GBS: NEGATIVE

## 2020-01-09 LAB — OB RESULTS CONSOLE HIV ANTIBODY (ROUTINE TESTING): HIV: NONREACTIVE

## 2020-01-09 MED ORDER — OXYCODONE-ACETAMINOPHEN 5-325 MG PO TABS: 2.0000 | ORAL_TABLET | ORAL | Status: DC | PRN

## 2020-01-09 MED ORDER — MISOPROSTOL 200 MCG PO TABS: 1000.0000 ug | ORAL_TABLET | Freq: Once | ORAL | Status: DC

## 2020-01-09 MED ORDER — FENTANYL CITRATE (PF) 100 MCG/2ML IJ SOLN
100.0000 ug | INTRAMUSCULAR | Status: DC | PRN
  Administered 2020-01-09 (×2): 100 ug via INTRAVENOUS
  Filled 2020-01-09 (×2): qty 2

## 2020-01-09 MED ORDER — LACTATED RINGERS IV SOLN: 500.0000 mL | INTRAVENOUS | Status: DC | PRN

## 2020-01-09 MED ORDER — EPHEDRINE 5 MG/ML INJ: 10.0000 mg | INTRAVENOUS | Status: DC | PRN

## 2020-01-09 MED ORDER — METHYLERGONOVINE MALEATE 0.2 MG/ML IJ SOLN
INTRAMUSCULAR | Status: AC
  Administered 2020-01-09: 0.2 mg via INTRAMUSCULAR
  Filled 2020-01-09: qty 1

## 2020-01-09 MED ORDER — DIPHENHYDRAMINE HCL 50 MG/ML IJ SOLN: 12.5000 mg | INTRAMUSCULAR | Status: DC | PRN

## 2020-01-09 MED ORDER — LIDOCAINE-EPINEPHRINE (PF) 2 %-1:200000 IJ SOLN
INTRAMUSCULAR | Status: DC | PRN
  Administered 2020-01-09 (×2): 2 mL via EPIDURAL

## 2020-01-09 MED ORDER — MISOPROSTOL 200 MCG PO TABS
ORAL_TABLET | ORAL | Status: AC
  Filled 2020-01-09: qty 5

## 2020-01-09 MED ORDER — ONDANSETRON HCL 4 MG/2ML IJ SOLN
4.0000 mg | Freq: Four times a day (QID) | INTRAMUSCULAR | Status: DC | PRN
  Administered 2020-01-09: 4 mg via INTRAVENOUS
  Filled 2020-01-09: qty 2

## 2020-01-09 MED ORDER — FENTANYL-BUPIVACAINE-NACL 0.5-0.125-0.9 MG/250ML-% EP SOLN: 12.0000 mL/h | EPIDURAL | Status: DC | PRN

## 2020-01-09 MED ORDER — FENTANYL-BUPIVACAINE-NACL 0.5-0.125-0.9 MG/250ML-% EP SOLN
EPIDURAL | Status: AC
  Filled 2020-01-09: qty 250

## 2020-01-09 MED ORDER — SODIUM CHLORIDE (PF) 0.9 % IJ SOLN
INTRAMUSCULAR | Status: DC | PRN
  Administered 2020-01-09: 12 mL/h via EPIDURAL

## 2020-01-09 MED ORDER — OXYTOCIN 40 UNITS IN NORMAL SALINE INFUSION - SIMPLE MED
2.5000 [IU]/h | INTRAVENOUS | Status: DC
  Filled 2020-01-09: qty 1000

## 2020-01-09 MED ORDER — OXYTOCIN BOLUS FROM INFUSION
500.0000 mL | Freq: Once | INTRAVENOUS | Status: AC
  Administered 2020-01-09: 500 mL via INTRAVENOUS

## 2020-01-09 MED ORDER — LACTATED RINGERS IV SOLN: 500.0000 mL | Freq: Once | INTRAVENOUS | Status: DC

## 2020-01-09 MED ORDER — PHENYLEPHRINE 40 MCG/ML (10ML) SYRINGE FOR IV PUSH (FOR BLOOD PRESSURE SUPPORT): 80.0000 ug | PREFILLED_SYRINGE | INTRAVENOUS | Status: DC | PRN

## 2020-01-09 MED ORDER — LIDOCAINE HCL (PF) 1 % IJ SOLN: 30.0000 mL | INTRAMUSCULAR | Status: DC | PRN

## 2020-01-09 MED ORDER — SOD CITRATE-CITRIC ACID 500-334 MG/5ML PO SOLN: 30.0000 mL | ORAL | Status: DC | PRN

## 2020-01-09 MED ORDER — METHYLERGONOVINE MALEATE 0.2 MG/ML IJ SOLN: 0.2000 mg | Freq: Once | INTRAMUSCULAR | Status: AC

## 2020-01-09 MED ORDER — LACTATED RINGERS IV BOLUS
1000.0000 mL | Freq: Once | INTRAVENOUS | Status: AC
  Administered 2020-01-09: 1000 mL via INTRAVENOUS

## 2020-01-09 MED ORDER — ACETAMINOPHEN 325 MG PO TABS
650.0000 mg | ORAL_TABLET | ORAL | Status: DC | PRN
  Administered 2020-01-09: 650 mg via ORAL
  Filled 2020-01-09: qty 2

## 2020-01-09 MED ORDER — OXYCODONE-ACETAMINOPHEN 5-325 MG PO TABS: 1.0000 | ORAL_TABLET | ORAL | Status: DC | PRN

## 2020-01-09 MED ORDER — LACTATED RINGERS IV SOLN: INTRAVENOUS | Status: DC

## 2020-01-09 NOTE — MAU Note (Signed)
covid swab collected

## 2020-01-09 NOTE — Progress Notes (Signed)
EFM removed.  Pt to ambulate in room.
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
## 2020-01-09 NOTE — Anesthesia Procedure Notes (Signed)
Epidural Patient location during procedure: OB Start time: 01/09/2020 6:25 PM End time: 01/09/2020 6:40 PM  Staffing Anesthesiologist: Elmer Picker, MD Performed: anesthesiologist   Preanesthetic Checklist Completed: patient identified, IV checked, risks and benefits discussed, monitors and equipment checked, pre-op evaluation and timeout performed  Epidural Patient position: sitting Prep: DuraPrep and site prepped and draped Patient monitoring: continuous pulse ox, blood pressure, heart rate and cardiac monitor Approach: midline Location: L3-L4 Injection technique: LOR air  Needle:  Needle type: Tuohy  Needle gauge: 17 G Needle length: 9 cm Needle insertion depth: 4 cm Catheter type: closed end flexible Catheter size: 19 Gauge Catheter at skin depth: 10 cm Test dose: negative  Assessment Sensory level: T8 Events: blood not aspirated, injection not painful, no injection resistance, no paresthesia and negative IV test  Additional Notes Patient identified. Risks/Benefits/Options discussed with patient including but not limited to bleeding, infection, nerve damage, paralysis, failed block, incomplete pain control, headache, blood pressure changes, nausea, vomiting, reactions to medication both or allergic, itching and postpartum back pain. Confirmed with bedside nurse the patient's most recent platelet count. Confirmed with patient that they are not currently taking any anticoagulation, have any bleeding history or any family history of bleeding disorders. Patient expressed understanding and wished to proceed. All questions were answered. Sterile technique was used throughout the entire procedure. Please see nursing notes for vital signs. Test dose was given through epidural catheter and negative prior to continuing to dose epidural or start infusion. Warning signs of high block given to the patient including shortness of breath, tingling/numbness in hands, complete motor block,  or any concerning symptoms with instructions to call for help. Patient was given instructions on fall risk and not to get out of bed. All questions and concerns addressed with instructions to call with any issues or inadequate analgesia.  Reason for block:procedure for pain

## 2020-01-09 NOTE — Progress Notes (Signed)
Labor Progress Note Janet [name] is a 24 y.o. [phone] at [redacted]w[redacted]d presented for SOL S: Epidural in place, very comfortable  O:  BP 119/75   Pulse 88   Temp 98.2 F (36.8 C) (Oral)   Resp 18   Ht 5\' 5"  (1.651 m)   Wt 68.5 kg   LMP 04/13/2019   SpO2 100%   BMI 25.13 kg/m  EFM: 145 bpm/+accels/-decels  CVE: Dilation: 6.5 Effacement (%): 80 Station: -3 Presentation: Vertex Exam by:: Dr. [email or web address]   A&P: 24 y.o. 22 [redacted]w[redacted]d  #Labor: Progressing well. AROM for clear at 1900. Ctx regular q2-3 min. Expect quick progression. #Pain: Epidural #FWB: Cat I #GBS negative  [redacted]w[redacted]d, MD 7:04 PM

## 2020-01-09 NOTE — Discharge Summary (Addendum)
Postpartum Discharge Summary    Patient Name: Janet [name] DOB: 09/30/1996 MRN: [id number]  Date of admission: 01/09/2020 Delivering Provider: ECKSTAT, MATTHEW M   Date of discharge: 01/11/2020  Admitting diagnosis: Normal labor [O80, Z37.9] Intrauterine pregnancy: [redacted]w[redacted]d     Secondary diagnosis:  Active Problems:   Normal labor  Additional problems: language barrier ([name])     Discharge diagnosis: Term Pregnancy Delivered                                                                                                Post partum procedures: Nexplanon insertion 01/11/20  Augmentation: AROM  Complications: None  Hospital course:  Onset of Labor With Vaginal Delivery     24 y.o. yo G4P3003 at [redacted]w[redacted]d was admitted in Latent Labor on 01/09/2020. Patient had an uncomplicated labor course as follows: arrived at 4cm, progressed to 7cm and had AROM and then progressed to complete.  Membrane Rupture Time/Date: 6:59 PM ,01/09/2020   Intrapartum Procedures: Episiotomy: None [1]                                         Lacerations:  None [1]  Patient had a delivery of a Viable infant. 01/09/2020  Information for the patient's newborn:  Hult, Boy Solara [[id number]]  Delivery Method: Vag-Spont    Pateint had an uncomplicated postpartum course.  She is ambulating, tolerating a regular diet, passing flatus, and urinating well. Patient is discharged home in stable condition on 01/11/20.  Delivery time: 9:44 PM    Magnesium Sulfate received: No BMZ received: No Rhophylac:N/A MMR:N/A Transfusion:No  Physical exam  Vitals:   01/10/20 0850 01/10/20 1150 01/10/20 2158 01/11/20 0537  BP: 109/76 (!) 90/55 101/72 103/79  Pulse: 67 (!) 58 67 (!) 56  Resp: 16 16  17  Temp: 98.5 F (36.9 C) 97.8 F (36.6 C) 97.8 F (36.6 C) 98 F (36.7 C)  TempSrc: Oral Oral Oral Oral  SpO2: 99% 100% 100% 99%  Weight:      Height:       General: alert, cooperative and no distress Lochia: appropriate Uterine  Fundus: firm Incision: N/A DVT Evaluation: No evidence of DVT seen on physical exam. Negative Homan's sign. No cords or calf tenderness. Labs: Lab Results  Component Value Date   WBC 10.1 01/09/2020   HGB 10.0 (L) 01/09/2020   HCT 31.2 (L) 01/09/2020   MCV 92.6 01/09/2020   PLT 177 01/09/2020   CMP Latest Ref Rng & Units 12/04/2017  Glucose 65 - 99 mg/dL 107(H)  BUN 6 - 20 mg/dL 9  Creatinine 0.44 - 1.00 mg/dL 0.59  Sodium 135 - 145 mmol/L 140  Potassium 3.5 - 5.1 mmol/L 3.7  Chloride 101 - 111 mmol/L 105  CO2 22 - 32 mmol/L 27  Calcium 8.9 - 10.3 mg/dL 9.2  Total Protein 6.5 - 8.1 g/dL 8.2(H)  Total Bilirubin 0.3 - 1.2 mg/dL 0.5  Alkaline Phos 38 - 126 U/L 68  AST 15 - 41 U/L 23  ALT   14 - 54 U/L 18    Discharge instruction: per After Visit Summary and "Baby and Me Booklet".  After visit meds:  Allergies as of 01/11/2020   No Known Allergies      Medication List     TAKE these medications    ibuprofen 600 MG tablet Commonly known as: ADVIL Take 1 tablet (600 mg total) by mouth every 6 (six) hours.   prenatal multivitamin Tabs tablet Take 1 tablet by mouth daily at 12 noon.        Diet: routine diet  Activity: Advance as tolerated. Pelvic rest for 6 weeks.   Outpatient follow up:6 weeks Follow up Appt:No future appointments. Follow up Visit: Follow-up Information     Department, [facility]. Schedule an appointment as soon as possible for a visit in 6 week(s).   Why: for postpartum checkup Contact information: [address] [place] [address] [phone]              Please schedule this patient for Postpartum visit in: 6 weeks with the following provider: Any provider Virtual For C/S patients schedule nurse incision check in weeks 2 weeks: no High risk pregnancy complicated by:  IPV Delivery mode:  SVD Anticipated Birth Control:  PP Nexplanon placed  01/11/20 PP Procedures needed:  n/a   Schedule Integrated BH  visit: no     Newborn Data: Live born female  Birth Weight:  7#10oz APGAR: 9, 9  Newborn Delivery   Birth date/time: 01/09/2020 21:44:00 Delivery type: Vaginal, Spontaneous      Baby Feeding: Breast Disposition:home with mother   01/11/2020 Frances Cresenzo-Dishmon, CNM   

## 2020-01-09 NOTE — MAU Note (Signed)
Presents with c/o abdominal pain that began last night.  Denies VB or LOF.  Reports +FM.  States due date is February 9th.

## 2020-01-09 NOTE — Anesthesia Preprocedure Evaluation (Signed)
Anesthesia Evaluation  Patient identified by MRN, date of birth, ID band Patient awake    Reviewed: Allergy & Precautions, NPO status , Patient's Chart, lab work & pertinent test results  Airway Mallampati: II  TM Distance: >3 FB Neck ROM: Full    Dental no notable dental hx.    Pulmonary neg pulmonary ROS,    Pulmonary exam normal breath sounds clear to auscultation       Cardiovascular negative cardio ROS Normal cardiovascular exam Rhythm:Regular Rate:Normal     Neuro/Psych PSYCHIATRIC DISORDERS Depression negative neurological ROS     GI/Hepatic negative GI ROS, Neg liver ROS,   Endo/Other  negative endocrine ROS  Renal/GU negative Renal ROS  negative genitourinary   Musculoskeletal negative musculoskeletal ROS (+)   Abdominal   Peds  Hematology negative hematology ROS (+) anemia ,   Anesthesia Other Findings   Reproductive/Obstetrics (+) Pregnancy                             Anesthesia Physical Anesthesia Plan  ASA: II  Anesthesia Plan: Epidural   Post-op Pain Management:    Induction:   PONV Risk Score and Plan: Treatment may vary due to age or medical condition  Airway Management Planned: Natural Airway  Additional Equipment:   Intra-op Plan:   Post-operative Plan:   Informed Consent: I have reviewed the patients History and Physical, chart, labs and discussed the procedure including the risks, benefits and alternatives for the proposed anesthesia with the patient or authorized representative who has indicated his/her understanding and acceptance.       Plan Discussed with: Anesthesiologist  Anesthesia Plan Comments: (Patient identified. Risks, benefits, options discussed with patient including but not limited to bleeding, infection, nerve damage, paralysis, failed block, incomplete pain control, headache, blood pressure changes, nausea, vomiting, reactions to  medication, itching, and post partum back pain. Confirmed with bedside nurse the patient's most recent platelet count. Confirmed with the patient that they are not taking any anticoagulation, have any bleeding history or any family history of bleeding disorders. Patient expressed understanding and wishes to proceed. All questions were answered. )        Anesthesia Quick Evaluation
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
## 2020-01-10 ENCOUNTER — Encounter (HOSPITAL_COMMUNITY): Payer: Self-pay | Admitting: Obstetrics and Gynecology

## 2020-01-10 LAB — RPR: RPR Ser Ql: NONREACTIVE

## 2020-01-10 MED ORDER — ETONOGESTREL 68 MG ~~LOC~~ IMPL
68.0000 mg | DRUG_IMPLANT | Freq: Once | SUBCUTANEOUS | Status: AC
  Administered 2020-01-11: 68 mg via SUBCUTANEOUS
  Filled 2020-01-10: qty 1

## 2020-01-10 MED ORDER — COCONUT OIL OIL: 1.0000 "application " | TOPICAL_OIL | Status: DC | PRN

## 2020-01-10 MED ORDER — ACETAMINOPHEN 325 MG PO TABS
650.0000 mg | ORAL_TABLET | ORAL | Status: DC | PRN
  Administered 2020-01-10 – 2020-01-11 (×3): 650 mg via ORAL
  Filled 2020-01-10 (×3): qty 2

## 2020-01-10 MED ORDER — TETANUS-DIPHTH-ACELL PERTUSSIS 5-2.5-18.5 LF-MCG/0.5 IM SUSP: 0.5000 mL | Freq: Once | INTRAMUSCULAR | Status: DC

## 2020-01-10 MED ORDER — ONDANSETRON HCL 4 MG PO TABS: 4.0000 mg | ORAL_TABLET | ORAL | Status: DC | PRN

## 2020-01-10 MED ORDER — DIPHENHYDRAMINE HCL 25 MG PO CAPS: 25.0000 mg | ORAL_CAPSULE | Freq: Four times a day (QID) | ORAL | Status: DC | PRN

## 2020-01-10 MED ORDER — IBUPROFEN 600 MG PO TABS
600.0000 mg | ORAL_TABLET | Freq: Four times a day (QID) | ORAL | Status: DC
  Administered 2020-01-10 – 2020-01-11 (×7): 600 mg via ORAL
  Filled 2020-01-10 (×7): qty 1

## 2020-01-10 MED ORDER — ZOLPIDEM TARTRATE 5 MG PO TABS: 5.0000 mg | ORAL_TABLET | Freq: Every evening | ORAL | Status: DC | PRN

## 2020-01-10 MED ORDER — BENZOCAINE-MENTHOL 20-0.5 % EX AERO: 1.0000 "application " | INHALATION_SPRAY | CUTANEOUS | Status: DC | PRN

## 2020-01-10 MED ORDER — LIDOCAINE HCL 1 % IJ SOLN
0.0000 mL | Freq: Once | INTRAMUSCULAR | Status: AC | PRN
  Administered 2020-01-11: 20 mL via INTRADERMAL
  Filled 2020-01-10: qty 20

## 2020-01-10 MED ORDER — IBUPROFEN 600 MG PO TABS: 600.0000 mg | ORAL_TABLET | Freq: Four times a day (QID) | ORAL | 0 refills | Status: DC

## 2020-01-10 MED ORDER — DIBUCAINE (PERIANAL) 1 % EX OINT: 1.0000 "application " | TOPICAL_OINTMENT | CUTANEOUS | Status: DC | PRN

## 2020-01-10 MED ORDER — SIMETHICONE 80 MG PO CHEW: 80.0000 mg | CHEWABLE_TABLET | ORAL | Status: DC | PRN

## 2020-01-10 MED ORDER — SENNOSIDES-DOCUSATE SODIUM 8.6-50 MG PO TABS
2.0000 | ORAL_TABLET | ORAL | Status: DC
  Administered 2020-01-10 (×2): 2 via ORAL
  Filled 2020-01-10 (×2): qty 2

## 2020-01-10 MED ORDER — OXYCODONE HCL 5 MG PO TABS
5.0000 mg | ORAL_TABLET | ORAL | Status: DC | PRN
  Administered 2020-01-10: 10 mg via ORAL
  Administered 2020-01-10: 5 mg via ORAL
  Administered 2020-01-11: 10 mg via ORAL
  Filled 2020-01-10: qty 1
  Filled 2020-01-10 (×2): qty 2

## 2020-01-10 MED ORDER — WITCH HAZEL-GLYCERIN EX PADS: 1.0000 "application " | MEDICATED_PAD | CUTANEOUS | Status: DC | PRN

## 2020-01-10 MED ORDER — ONDANSETRON HCL 4 MG/2ML IJ SOLN: 4.0000 mg | INTRAMUSCULAR | Status: DC | PRN

## 2020-01-10 MED ORDER — PRENATAL MULTIVITAMIN CH
1.0000 | ORAL_TABLET | Freq: Every day | ORAL | Status: DC
  Administered 2020-01-10 – 2020-01-11 (×2): 1 via ORAL
  Filled 2020-01-10 (×2): qty 1

## 2020-01-10 NOTE — Progress Notes (Signed)
POSTPARTUM PROGRESS NOTE  Post Partum Day 1  Subjective:  Janet [name] is a 24 y.o. L7N3005 s/p SVD at 110w1d.  She reports she is doing well. No acute events overnight. She denies any problems with ambulating, voiding or po intake. Denies nausea or vomiting.  Pain is well controlled.  Lochia is appropriate. Not complaining of vaginal bleeding today as compared to yesterday.  Objective: Blood pressure (!) 92/56, pulse 70, temperature 99.6 F (37.6 C), temperature source Oral, resp. rate 18, height 5\' 5"  (1.651 m), weight 68.5 kg, last menstrual period 04/13/2019, SpO2 99 %, unknown if currently breastfeeding.  Physical Exam:  General: alert, cooperative and no distress Chest: no respiratory distress Heart:regular rate, distal pulses intact Abdomen: soft, nontender Uterine Fundus: firm, appropriately tender DVT Evaluation: No calf swelling or tenderness Extremities: no edema Skin: warm, dry  Recent Labs    01/09/20 1700  HGB 10.0*  HCT 31.2*    Assessment/Plan: Alexya Mcdaris is a 24 y.o. 22 s/p SVD at [redacted]w[redacted]d   PPD#1 - Doing well. Continue routine postpartum care.  Contraception: Nexplanon Feeding: Breast Dispo: Plan for discharge today or tomorrow, dependent on SW consult and Nexplanon placement.   LOS: 1 day    [redacted]w[redacted]d, DO, PGY-1 OBGYN Faculty Teaching Service  01/10/2020, 6:46 AM

## 2020-01-10 NOTE — Progress Notes (Signed)
CSW acknowledged consult and has attempted to meet with MOB on two different occasions. However, both times MOB noted to be sleeping.  CSW will attempt to meet with MOB at a later time.  Lear Ng, LCSW [facility] [phone]

## 2020-01-10 NOTE — Anesthesia Postprocedure Evaluation (Signed)
Anesthesia Post Note  Patient: Janet [name]  Procedure(s) Performed: AN AD HOC LABOR EPIDURAL     Patient location during evaluation: Mother Baby Anesthesia Type: Epidural Level of consciousness: awake and alert Pain management: pain level controlled Vital Signs Assessment: post-procedure vital signs reviewed and stable Respiratory status: spontaneous breathing, nonlabored ventilation and respiratory function stable Cardiovascular status: stable Postop Assessment: no headache, no backache and epidural receding Anesthetic complications: no    Last Vitals:  Vitals:   01/10/20 0500 01/10/20 0850  BP: (!) 92/56 109/76  Pulse: 70 67  Resp: 18 16  Temp: 37.6 C 36.9 C  SpO2: 99% 99%    Last Pain:  Vitals:   01/10/20 0952  TempSrc:   PainSc: 8    Pain Goal: Patients Stated Pain Goal: 0 (01/09/20 2000)                 Yuki Purves

## 2020-01-10 NOTE — Progress Notes (Addendum)
Post Partum Day 1  Janet [name] is a 24 y.o. [id number] s/p SVD at [redacted]w[redacted]d.  Subjective: Reports feeling well today with complaints of mild right sided musculoskeletal pain in hip and lower back. Desires postpartum contraception that lasts longer than 3 years and preferably for up to 10 years.  Objective: Vitals:   01/10/20 0028 01/10/20 0055 01/10/20 0500 01/10/20 0850  BP:  119/82 (!) 92/56 109/76  Pulse:  81 70 67  Resp:  17 18 16   Temp: (!) 101.7 F (38.7 C) (!) 100.7 F (38.2 C) 99.6 F (37.6 C) 98.5 F (36.9 C)  TempSrc: Oral Oral Oral Oral  SpO2:  100% 99% 99%  Weight:      Height:        I/O last 3 completed shifts: In: -  Out: 1107 [Urine:850; Blood:257] No intake/output data recorded.  Labs:   Recent Labs    01/09/20 1700  WBC 10.1  HGB 10.0*  HCT 31.2*  PLT 177    Assessment / Plan: 24 y.o. 22 s/p SVD at [redacted]w[redacted]d.  PPD#1 - K-Pad ordered for musculoskeletal pain. Reassured of normal physiologic postpartum discomforts. Continue routine postpartum care.  Contraception: Postpartum IUD - To be inserted at 6 week postpartum visit. Advised of importance of abstinence until after 6 week postpartum visit to prevent unintended pregnancy.  [name] interpreter used for this patient interaction.  [redacted]w[redacted]d, SNM, BSN 01/10/2020, 10:03 AM   I confirm that I have verified the information documented in the nurse midwife student's note and that I have also personally reperformed the history, physical exam and all medical decision making activities of this service and have verified that all service and findings are accurately documented in this student's note.  -Patient initially wanted Nexplanon inpatient, but was discouraged by family members. Grandmother wants her to have a longer-acting method, such as IUD.  -SNM discussed options, recommend abstinence and in-person PP for IUD.   Janet, [name], CNM 01/10/2020 10:26 AM

## 2020-01-10 NOTE — Discharge Instructions (Signed)

## 2020-01-10 NOTE — Progress Notes (Signed)
Spoke with midwife about Nexplanon birth control and if patient wanted it. Used interpreter to speak with patient and she agreed to have Nexplanon inserted while here in the hospital.

## 2020-01-11 ENCOUNTER — Encounter (HOSPITAL_COMMUNITY): Payer: Self-pay | Admitting: Obstetrics and Gynecology

## 2020-01-11 DIAGNOSIS — Z30017 Encounter for initial prescription of implantable subdermal contraceptive: Secondary | ICD-10-CM
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
## 2020-01-11 NOTE — Procedures (Signed)
Post-Placental Nexplanon Insertion Procedure Note  Patient was identified. Informed consent was signed, signed copy in chart. A time-out was performed.    The insertion site was identified 8-10 cm (3-4 inches) from the medial epicondyle of the humerus and 3-5 cm (1.25-2 inches) posterior to (below) the sulcus (groove) between the biceps and triceps muscles of the patient's left arm and marked. The site was prepped and draped in the usual sterile fashion. Pt was prepped with alcohol swab and then injected with 2 cc of 1% lidocaine. The site was prepped with betadine. Nexplanon removed form packaging,  Device confirmed in needle, then inserted full length of needle and withdrawn per handbook instructions. Provider and patient verified presence of the implant in the woman's arm by palpation. Pt insertion site was covered with steristrips/adhesive bandage and pressure bandage. There was minimal blood loss. Patient tolerated procedure well.  Patient was given post procedure instructions and Nexplanon user card with expiration date. Condoms were recommended for STI prevention. Patient was asked to keep the pressure dressing on for 24 hours to minimize bruising and keep the adhesive bandage on for 3-5 days. The patient verbalized understanding of the plan of care and agrees.   Interpreter used.

## 2020-05-19 ENCOUNTER — Encounter (HOSPITAL_COMMUNITY): Payer: Self-pay | Admitting: Emergency Medicine

## 2020-05-19 ENCOUNTER — Other Ambulatory Visit: Payer: Self-pay

## 2020-05-19 ENCOUNTER — Ambulatory Visit (HOSPITAL_COMMUNITY)
Admission: EM | Admit: 2020-05-19 | Discharge: 2020-05-19 | Disposition: A | Discharge status: Home / Self Care | Payer: Medicaid Other | Attending: Family Medicine | Admitting: Family Medicine

## 2020-05-19 ENCOUNTER — Ambulatory Visit (INDEPENDENT_AMBULATORY_CARE_PROVIDER_SITE_OTHER): Payer: Medicaid Other

## 2020-05-19 DIAGNOSIS — F411 Generalized anxiety disorder: Secondary | ICD-10-CM | POA: Insufficient documentation

## 2020-05-19 DIAGNOSIS — R079 Chest pain, unspecified: Secondary | ICD-10-CM | POA: Diagnosis not present

## 2020-05-19 DIAGNOSIS — R0602 Shortness of breath: Secondary | ICD-10-CM | POA: Diagnosis not present

## 2020-05-19 LAB — CBC WITH DIFFERENTIAL/PLATELET
Abs Immature Granulocytes: 0.02 10*3/uL (ref 0.00–0.07)
Basophils Absolute: 0 10*3/uL (ref 0.0–0.1)
Basophils Relative: 0 %
Eosinophils Absolute: 0.3 10*3/uL (ref 0.0–0.5)
Eosinophils Relative: 5 %
HCT: 38.5 % (ref 36.0–46.0)
Hemoglobin: 12.8 g/dL (ref 12.0–15.0)
Immature Granulocytes: 0 %
Lymphocytes Relative: 23 %
Lymphs Abs: 1.5 10*3/uL (ref 0.7–4.0)
MCH: 30 pg (ref 26.0–34.0)
MCHC: 33.2 g/dL (ref 30.0–36.0)
MCV: 90.4 fL (ref 80.0–100.0)
Monocytes Absolute: 0.4 10*3/uL (ref 0.1–1.0)
Monocytes Relative: 5 %
Neutro Abs: 4.3 10*3/uL (ref 1.7–7.7)
Neutrophils Relative %: 67 %
Platelets: 248 10*3/uL (ref 150–400)
RBC: 4.26 MIL/uL (ref 3.87–5.11)
RDW: 13.6 % (ref 11.5–15.5)
WBC: 6.5 10*3/uL (ref 4.0–10.5)
nRBC: 0 % (ref 0.0–0.2)

## 2020-05-19 LAB — COMPREHENSIVE METABOLIC PANEL
ALT: 74 U/L — ABNORMAL HIGH (ref 0–44)
AST: 40 U/L (ref 15–41)
Albumin: 4.2 g/dL (ref 3.5–5.0)
Alkaline Phosphatase: 92 U/L (ref 38–126)
Anion gap: 8 (ref 5–15)
BUN: 8 mg/dL (ref 6–20)
CO2: 25 mmol/L (ref 22–32)
Calcium: 9.5 mg/dL (ref 8.9–10.3)
Chloride: 107 mmol/L (ref 98–111)
Creatinine, Ser: 0.61 mg/dL (ref 0.44–1.00)
GFR calc Af Amer: >60 mL/min (ref >60)
GFR calc non Af Amer: >60 mL/min (ref >60)
Glucose, Bld: 110 mg/dL — ABNORMAL HIGH (ref 70–99)
Potassium: 4.2 mmol/L (ref 3.5–5.1)
Sodium: 140 mmol/L (ref 135–145)
Total Bilirubin: 0.8 mg/dL (ref 0.3–1.2)
Total Protein: 7.8 g/dL (ref 6.5–8.1)

## 2020-05-19 LAB — TSH: TSH: 1.518 u[IU]/mL (ref 0.350–4.500)

## 2020-05-19 MED ORDER — HYDROXYZINE HCL 25 MG PO TABS: 25.0000 mg | ORAL_TABLET | Freq: Four times a day (QID) | ORAL | 0 refills | Status: DC

## 2020-05-19 NOTE — Discharge Instructions (Addendum)
Your EKG and x ray were normal This is most likely anxiety I am checking some blood work and will call you of abnormal Otherwise I will not call.  Hydroxyzine as needed for anxiety attack.  Giving you contact for primary care doctor to follow up.

## 2020-05-19 NOTE — ED Triage Notes (Signed)
Pt c/o rapid heart beat x 3 days. Pt states it feels better when she lies down. She also feels dizzy x 3 days. Pt states she has had similar symptoms in the past. Denies SOB.

## 2020-05-20 NOTE — ED Provider Notes (Signed)
[facility]    CSN: [id number] Arrival date & time: 05/19/20  1346      History   Chief Complaint Chief Complaint  Patient presents with  . Chest Pain    HPI Janet [name] is a 24 y.o. female.   Patient is 24 year old female with past medical history of depression.  She comes in today with approximately 3 days of chest discomfort, palpitations and increased heart rate.  Symptoms have been intermittent, waxing waning.  Feels better when she lies down.  She has had some mild dizziness with these episodes.  Similar symptoms in the past with anxiety.  Symptoms her hands and arms will feel tingly and numb.  Denies any shortness of breath, nausea, vomiting, abdominal pain.  Denies any cough or fevers.  Under a lot of stress.  Has history of suicidal ideations.  Currently stays at home with 3 children.  Recent pregnancy in January.  She is not breast-feeding.  Denies any drug use.  ROS per HPI   All information obtained using the Publishing copy     Past Medical History:  Diagnosis Date  . Anemia   . Depression     Patient Active Problem List   Diagnosis Date Noted  . Normal labor 01/09/2020  . Oligohydramnios 11/14/2018  . Self-inflicted laceration of wrist (HCC)   . MDD (major depressive disorder), single episode, severe (HCC) 11/22/2017  . Pregnancy 07/16/2015  . [redacted] weeks gestation of pregnancy   . Encounter for fetal anatomic survey     Past Surgical History:  Procedure Laterality Date  . NO PAST SURGERIES      OB History    Gravida  4   Para  4   Term  4   Preterm  0   AB  0   Living  4     SAB  0   TAB  0   Ectopic  0   Multiple  0   Live Births  4            Home Medications    Prior to Admission medications   Medication Sig Start Date End Date Taking? Authorizing Provider  hydrOXYzine (ATARAX/VISTARIL) 25 MG tablet Take 1 tablet (25 mg total) by mouth every 6 (six) hours. Take 1/2 tab to full tab as needed every 6 hours for  acute anxiety 05/19/20   [name] A, NP  ibuprofen (ADVIL) 600 MG tablet Take 1 tablet (600 mg total) by mouth every 6 (six) hours. 01/11/20   Cresenzo-Dishmon, Scarlette Calico, CNM  Prenatal Vit-Fe Fumarate-FA (PRENATAL MULTIVITAMIN) TABS tablet Take 1 tablet by mouth daily at 12 noon. 08/14/19   [name], MD    Family History Family History  Problem Relation Age of Onset  . Healthy Mother   . Healthy Father     Social History Social History   Tobacco Use  . Smoking status: Never Smoker  . Smokeless tobacco: Never Used  Substance Use Topics  . Alcohol use: Yes    Alcohol/week: 1.0 standard drinks    Types: 1 Glasses of wine per week    Comment: Occasionally  . Drug use: No     Allergies   Patient has no known allergies.   Review of Systems Review of Systems   Physical Exam Triage Vital Signs ED Triage Vitals  Enc Vitals Group     BP 05/19/20 1406 126/82     Pulse Rate 05/19/20 1406 86     Resp 05/19/20 1406  18     Temp 05/19/20 1406 98.3 F (36.8 C)     Temp Source 05/19/20 1406 Oral     SpO2 05/19/20 1406 100 %     Weight --      Height --      Head Circumference --      Peak Flow --      Pain Score 05/19/20 1408 6     Pain Loc --      Pain Edu? --      Excl. in [place]? --    No data found.  Updated Vital Signs BP 126/82 (BP Location: Right Arm)   Pulse 86   Temp 98.3 F (36.8 C) (Oral)   Resp 18   LMP 05/19/2020   SpO2 100%   Visual Acuity Right Eye Distance:   Left Eye Distance:   Bilateral Distance:    Right Eye Near:   Left Eye Near:    Bilateral Near:     Physical Exam Vitals and nursing note reviewed.  Constitutional:      General: She is not in acute distress.    Appearance: Normal appearance. She is not ill-appearing, toxic-appearing or diaphoretic.  HENT:     Head: Normocephalic.     Nose: Nose normal.     Mouth/Throat:     Pharynx: Oropharynx is clear.  Eyes:     Conjunctiva/sclera: Conjunctivae normal.  Cardiovascular:      Rate and Rhythm: Normal rate and regular rhythm.  Pulmonary:     Effort: Pulmonary effort is normal.     Breath sounds: Normal breath sounds.  Musculoskeletal:        General: Normal range of motion.     Cervical back: Normal range of motion.  Skin:    General: Skin is warm and dry.     Findings: No rash.  Neurological:     General: No focal deficit present.     Mental Status: She is alert.  Psychiatric:     Comments: Anxious       UC Treatments / Results  Labs (all labs ordered are listed, but only abnormal results are displayed) Labs Reviewed  COMPREHENSIVE METABOLIC PANEL - Abnormal; Notable for the following components:      Result Value   Glucose, Bld 110 (*)    ALT 74 (*)    All other components within normal limits  CBC WITH DIFFERENTIAL/PLATELET  TSH    EKG   Radiology DG Chest 2 View  Result Date: 05/19/2020 CLINICAL DATA:  Chest pain and shortness of breath EXAM: CHEST - 2 VIEW COMPARISON:  March 25, 2017 FINDINGS: The lungs are clear. Heart size and pulmonary vascularity are normal. No adenopathy. No pneumothorax. No bone lesions. IMPRESSION: Lungs clear.  Cardiac silhouette within normal limits. Electronically Signed   By: Lowella Grip III M.D.   On: 05/19/2020 15:44    Procedures Procedures (including critical care time)  Medications Ordered in UC Medications - No data to display  Initial Impression / Assessment and Plan / UC Course  I have reviewed the triage vital signs and the nursing notes.  Pertinent labs & imaging results that were available during my care of the patient were reviewed by me and considered in my medical decision making (see chart for details).     Anxiety -most likely cause of her symptoms. Patient has history of similar. EKG with normal sinus rhythm with sinus arrhythmia. No concern for ACS at this time. Chest x-ray normal Blood work  mostly unremarkable besides elevated ALT at 74 Did not specifically speak with  patient about alcohol use. She does have history of alcohol use.  This is most likely the cause.  She was not having any acute abdominal pain, nausea, vomiting or jaundice. We will speak with patient over phone and discuss results and talk about this.  Prescribing hydroxyzine for anxiety as needed Contacts given for primary care follow-up Patient understanding of all information and agreed and discharged All information explained using the [place] translator  Final Clinical Impressions(s) / UC Diagnoses   Final diagnoses:  Anxiety state     Discharge Instructions     Your EKG and x ray were normal This is most likely anxiety I am checking some blood work and will call you of abnormal Otherwise I will not call.  Hydroxyzine as needed for anxiety attack.  Giving you contact for primary care doctor to follow up.       ED Prescriptions    Medication Sig Dispense Auth. Provider   hydrOXYzine (ATARAX/VISTARIL) 25 MG tablet Take 1 tablet (25 mg total) by mouth every 6 (six) hours. Take 1/2 tab to full tab as needed every 6 hours for acute anxiety 20 tablet Jancy Sprankle A, NP     PDMP not reviewed this encounter.   [name] A, NP 05/20/20 [phone]

## 2020-11-26 DIAGNOSIS — Z5321 Procedure and treatment not carried out due to patient leaving prior to being seen by health care provider: Secondary | ICD-10-CM | POA: Diagnosis not present

## 2020-11-26 DIAGNOSIS — R0789 Other chest pain: Secondary | ICD-10-CM | POA: Diagnosis not present

## 2020-11-26 DIAGNOSIS — R1013 Epigastric pain: Secondary | ICD-10-CM | POA: Diagnosis present

## 2020-11-27 ENCOUNTER — Emergency Department (HOSPITAL_COMMUNITY)
Admission: EM | Admit: 2020-11-27 | Discharge: 2020-11-27 | Disposition: A | Discharge status: Home / Self Care | Payer: Medicaid Other | Attending: Emergency Medicine | Admitting: Emergency Medicine

## 2020-11-27 ENCOUNTER — Encounter (HOSPITAL_COMMUNITY): Payer: Self-pay | Admitting: *Deleted

## 2020-11-27 ENCOUNTER — Other Ambulatory Visit: Payer: Self-pay

## 2020-11-27 NOTE — ED Notes (Signed)
Pt called 3x in waiting room for repeat vitals, no answer

## 2020-11-27 NOTE — ED Triage Notes (Signed)
The pt arrived by gems hours ago  C/o epigastric pain from them  She just told me chest pain for one month aand o x4 [place] is language but she speaks english ok lmp nov 8th

## 2020-11-27 NOTE — ED Notes (Signed)
Pt checked out AMA. 

## 2021-04-15 ENCOUNTER — Ambulatory Visit (INDEPENDENT_AMBULATORY_CARE_PROVIDER_SITE_OTHER): Payer: Medicaid Other | Admitting: Family Medicine

## 2021-04-15 ENCOUNTER — Encounter: Payer: Self-pay | Admitting: Family Medicine

## 2021-04-15 DIAGNOSIS — Z3202 Encounter for pregnancy test, result negative: Secondary | ICD-10-CM

## 2021-04-15 DIAGNOSIS — Z3046 Encounter for surveillance of implantable subdermal contraceptive: Secondary | ICD-10-CM | POA: Diagnosis not present

## 2021-04-15 LAB — POCT PREGNANCY, URINE: Preg Test, Ur: NEGATIVE

## 2021-04-15 NOTE — Progress Notes (Signed)
     GYNECOLOGY OFFICE PROCEDURE NOTE  Kimora Stankovic is a 25 y.o. [phone] here for Nexplanon removal.  Last pap smear was on 07/13/2018 and was normal. No other gynecologic concerns.  Nexplanon removal Procedure Patient identified, informed consent performed, consent signed.   Appropriate time out taken. Nexplanon site identified.  Area prepped in usual sterile fashon. One ml of 1% lidocaine was used to anesthetize the area at the distal end of the implant. A small stab incision was made right beside the implant on the distal portion.  The Nexplanon rod was grasped using hemostats and removed without difficulty.  There was minimal blood loss. There were no complications.  3 ml of 1% lidocaine was injected around the incision for post-procedure analgesia.  Steri-strips were applied over the small incision.  A pressure bandage was applied to reduce any bruising.  The patient tolerated the procedure well and was given post procedure instructions.  Patient is planning to use no method for contraception/attempt conception.  Return in about 4 months (around 08/16/2021) for repeat pap.  Venora Maples, MD/[facility], [facility] for [employer], [facility]

## 2021-04-15 NOTE — Patient Instructions (Signed)
Nexplanon Instructions After Removal  Keep bandage clean and dry for 24 hours  May use ice/Tylenol/Ibuprofen for soreness or pain  If you develop fever, drainage or increased warmth from incision site-contact office immediately   

## 2021-04-18 ENCOUNTER — Other Ambulatory Visit: Payer: Self-pay

## 2021-04-18 ENCOUNTER — Emergency Department (HOSPITAL_COMMUNITY): Payer: Medicaid Other

## 2021-04-18 ENCOUNTER — Emergency Department (HOSPITAL_COMMUNITY)
Admission: EM | Admit: 2021-04-18 | Discharge: 2021-04-18 | Disposition: A | Discharge status: Home / Self Care | Payer: Medicaid Other | Attending: Emergency Medicine | Admitting: Emergency Medicine

## 2021-04-18 ENCOUNTER — Encounter (HOSPITAL_COMMUNITY): Payer: Self-pay

## 2021-04-18 DIAGNOSIS — R109 Unspecified abdominal pain: Secondary | ICD-10-CM | POA: Diagnosis not present

## 2021-04-18 DIAGNOSIS — R0789 Other chest pain: Secondary | ICD-10-CM | POA: Diagnosis not present

## 2021-04-18 DIAGNOSIS — M545 Low back pain, unspecified: Secondary | ICD-10-CM | POA: Insufficient documentation

## 2021-04-18 DIAGNOSIS — F419 Anxiety disorder, unspecified: Secondary | ICD-10-CM | POA: Insufficient documentation

## 2021-04-18 LAB — CBC
HCT: 43.4 % (ref 36.0–46.0)
Hemoglobin: 14.3 g/dL (ref 12.0–15.0)
MCH: 30.2 pg (ref 26.0–34.0)
MCHC: 32.9 g/dL (ref 30.0–36.0)
MCV: 91.8 fL (ref 80.0–100.0)
Platelets: 261 10*3/uL (ref 150–400)
RBC: 4.73 MIL/uL (ref 3.87–5.11)
RDW: 13.2 % (ref 11.5–15.5)
WBC: 9.2 10*3/uL (ref 4.0–10.5)
nRBC: 0 % (ref 0.0–0.2)

## 2021-04-18 LAB — TROPONIN I (HIGH SENSITIVITY): Troponin I (High Sensitivity): 3 ng/L (ref <18)

## 2021-04-18 LAB — I-STAT BETA HCG BLOOD, ED (MC, WL, AP ONLY): I-stat hCG, quantitative: <5 m[IU]/mL (ref <5)

## 2021-04-18 LAB — BASIC METABOLIC PANEL
Anion gap: 6 (ref 5–15)
BUN: 7 mg/dL (ref 6–20)
CO2: 26 mmol/L (ref 22–32)
Calcium: 9.5 mg/dL (ref 8.9–10.3)
Chloride: 105 mmol/L (ref 98–111)
Creatinine, Ser: 0.58 mg/dL (ref 0.44–1.00)
GFR, Estimated: >60 mL/min (ref >60)
Glucose, Bld: 112 mg/dL — ABNORMAL HIGH (ref 70–99)
Potassium: 3.9 mmol/L (ref 3.5–5.1)
Sodium: 137 mmol/L (ref 135–145)

## 2021-04-18 MED ORDER — PANTOPRAZOLE SODIUM 20 MG PO TBEC: 20.0000 mg | DELAYED_RELEASE_TABLET | Freq: Every day | ORAL | 0 refills | Status: DC

## 2021-04-18 MED ORDER — HYDROXYZINE HCL 25 MG PO TABS: 25.0000 mg | ORAL_TABLET | Freq: Three times a day (TID) | ORAL | 0 refills | Status: AC | PRN
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
## 2021-04-18 NOTE — ED Triage Notes (Signed)
Patient reports chest pain, abdominal pain radiating into her back, also with itchy eyes and nasal congestion
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
## 2021-04-18 NOTE — Discharge Instructions (Signed)
Your chest pain is fortunately not related to any life-threatening condition.  It is likely due to heartburn.  Take Protonix as prescribed.  Avoid spicy food.  Use resource below for further care.  Take Vistaril as needed for anxiety

## 2021-05-06 ENCOUNTER — Encounter: Payer: Self-pay | Admitting: *Deleted

## 2021-05-18 NOTE — Congregational Nurse Program (Signed)
  Dept: [phone]   Congregational Nurse Program Note  Date of Encounter: 05/06/2021  Past Medical History: Past Medical History:  Diagnosis Date  . Anemia   . Depression     Encounter Details:  CNP Questionnaire - 05/06/21 1630      Questionnaire   Do you give verbal consent to treat you today? Yes    Visit Setting Church or Organization    Location Patient Served At [facility]    Patient Status Immigrant    Medical Provider No    Insurance Medicaid    Intervention Assess (including screenings)    ED Visit Averted Yes          Client came into [place] [facility] for BP check.  Educated client on healthy eating and to exercise daily for continued good health.  Encouraged to follow up with PCP for any other problems.  To follow up with this CN as needed.  Roderic Palau, RN, MSN, CNP [phone] Office [phone] Cell

## 2021-10-22 ENCOUNTER — Emergency Department (HOSPITAL_COMMUNITY): Payer: Medicaid Other

## 2021-10-22 ENCOUNTER — Emergency Department (HOSPITAL_COMMUNITY)
Admission: EM | Admit: 2021-10-22 | Discharge: 2021-10-22 | Disposition: A | Discharge status: Home / Self Care | Payer: Medicaid Other | Attending: Emergency Medicine | Admitting: Emergency Medicine

## 2021-10-22 ENCOUNTER — Encounter (HOSPITAL_COMMUNITY): Payer: Self-pay | Admitting: Emergency Medicine

## 2021-10-22 ENCOUNTER — Other Ambulatory Visit: Payer: Self-pay

## 2021-10-22 DIAGNOSIS — R0789 Other chest pain: Secondary | ICD-10-CM | POA: Diagnosis present

## 2021-10-22 DIAGNOSIS — R0602 Shortness of breath: Secondary | ICD-10-CM | POA: Diagnosis not present

## 2021-10-22 DIAGNOSIS — M549 Dorsalgia, unspecified: Secondary | ICD-10-CM | POA: Diagnosis not present

## 2021-10-22 DIAGNOSIS — R079 Chest pain, unspecified: Secondary | ICD-10-CM

## 2021-10-22 LAB — BASIC METABOLIC PANEL
Anion gap: 9 (ref 5–15)
BUN: 8 mg/dL (ref 6–20)
CO2: 24 mmol/L (ref 22–32)
Calcium: 9.7 mg/dL (ref 8.9–10.3)
Chloride: 107 mmol/L (ref 98–111)
Creatinine, Ser: 0.55 mg/dL (ref 0.44–1.00)
GFR, Estimated: >60 mL/min (ref >60)
Glucose, Bld: 89 mg/dL (ref 70–99)
Potassium: 3.7 mmol/L (ref 3.5–5.1)
Sodium: 140 mmol/L (ref 135–145)

## 2021-10-22 LAB — CBC
HCT: 40.7 % (ref 36.0–46.0)
Hemoglobin: 13.8 g/dL (ref 12.0–15.0)
MCH: 31.7 pg (ref 26.0–34.0)
MCHC: 33.9 g/dL (ref 30.0–36.0)
MCV: 93.3 fL (ref 80.0–100.0)
Platelets: 266 10*3/uL (ref 150–400)
RBC: 4.36 MIL/uL (ref 3.87–5.11)
RDW: 12.4 % (ref 11.5–15.5)
WBC: 8.1 10*3/uL (ref 4.0–10.5)
nRBC: 0 % (ref 0.0–0.2)

## 2021-10-22 LAB — TROPONIN I (HIGH SENSITIVITY)
Troponin I (High Sensitivity): <2 ng/L (ref <18)
Troponin I (High Sensitivity): <2 ng/L (ref <18)

## 2021-10-22 MED ORDER — OMEPRAZOLE 20 MG PO CPDR: 20.0000 mg | DELAYED_RELEASE_CAPSULE | Freq: Every day | ORAL | 0 refills | Status: AC

## 2021-10-22 NOTE — ED Notes (Signed)
Called pt for vitals, no response.

## 2021-10-22 NOTE — Discharge Instructions (Addendum)
You are seen here today for evaluation of your chest wall pain.  This is less likely cardiac in nature, and more likely GI.  You have been prescribed omeprazole, a PPI, to combat possible GERD symptoms. You have been referred to a GI practice that you will need to call and schedule a visit with. Please adhere to a bland diet. If you have any concern, new or worsening symptoms, please return to the emergency department.   --------------------------------------------------------------------------  ?????? ?????? ???????? ??????? ???????????? ???? ??????? ?? ???? ???????? ?? ?? ????????? ????????? ???????? ?? ?, ? ???? ???????? GI? ???????? GERD ???????? ??????? ???? ???????? ??????????, ?? ?????? ??????? ?????? ?? ??????? GI ???????? ??????? ? ??? ?????? ?? ???? ? ??????? ?????? ????? ?????? ?? ????? ??? ?????? ????? ?????????? ??? ???????? ???? ??????, ???? ?? ??????? ???????? ??? ???, ????? ???????? ??????? ??????????? Tap?'?k? ch?t?k? parkh?lak? dukh?'ik? m?ly??kanak? l?gi tap?'?l?'? ?ja yah?m? d?kh?'i'?k? cha. Y? prakr?tim? hr?dayagh?tak? sambh?van? kama cha, ra adhika sambh?van? GI. Sambh?vita GERD lak?a?ahar? virud'dha la?na tap?'?nl?'? ?m?pr?j?la, ?ka p?p?'?'? siph?risa gari'?k? cha. Tap?'?l?'? GI abhy?sam? pa?h?'i'?k? cha juna tap?'?l? kala garna ra bhrama?ak? t?lik? ban?'una ?va?yaka cha. Kr?pay? narama ?h?rak? p?lan? garnuh?s. Yadi tap?'im?sam?ga kunai cint?, nay?m? v? bigran? lak?a?ahar? chan bhan?, kr?pay? ?patak?l?na vibh?gam? pharkanuh?s.

## 2021-10-22 NOTE — ED Provider Notes (Signed)
[facility] EMERGENCY DEPARTMENT Provider Note   CSN: [id number] Arrival date & time: 10/22/21  1148     History Chief Complaint  Patient presents with   Chest Pain   Back Pain    Janet [name] is a 25 y.o. [name] female presents emergency department for chest wall pain.  Patient reports that in 2018 she was hit by a door at [employer] and has had intermittent pain since then.  Patient reports she has had 2 episodes of shortness of breath since 2018.  She reports today that the chest pain is diffuse and radiates to her upper back.  She reports the pain is worsened with eating.  No relieving factors.  Denies any nausea, vomiting, abdominal pain, fevers, cough, rhinorrhea, nasal congestion, shortness of breath, or leg swelling.  Patient denies any history of DVT or PE.  Patient had Nexplanon removed 6 months ago.  Denies any long travel or car rides.  Patient ports she has had this pain worked up before.  Denies any medical history.  Denies any surgical history.  Denies any daily medications.  No known drug allergies.  Denies any tobacco or illicit drug use.  Reports occasional alcohol consumption.  Interpreter service used during the entirety of this exam.   Chest Pain Associated symptoms: back pain   Associated symptoms: no abdominal pain, no cough, no fever, no nausea, no palpitations, no shortness of breath and no vomiting   Back Pain Associated symptoms: chest pain   Associated symptoms: no abdominal pain, no dysuria and no fever       Past Medical History:  Diagnosis Date   Anemia    Depression     Patient Active Problem List   Diagnosis Date Noted   Normal labor 01/09/2020   Oligohydramnios 11/14/2018   Self-inflicted laceration of wrist (HCC)    MDD (major depressive disorder), single episode, severe (HCC) 11/22/2017   Pregnancy 07/16/2015   [redacted] weeks gestation of pregnancy    Encounter for fetal anatomic survey     Past Surgical History:   Procedure Laterality Date   NO PAST SURGERIES       OB History     Gravida  4   Para  4   Term  4   Preterm  0   AB  0   Living  4      SAB  0   IAB  0   Ectopic  0   Multiple  0   Live Births  4           Family History  Problem Relation Age of Onset   Healthy Mother    Healthy Father     Social History   Tobacco Use   Smoking status: Never   Smokeless tobacco: Never  Vaping Use   Vaping Use: Never used  Substance Use Topics   Alcohol use: Yes    Alcohol/week: 1.0 standard drink    Types: 1 Glasses of wine per week    Comment: Occasionally   Drug use: No    Home Medications Prior to Admission medications   Medication Sig Start Date End Date Taking? Authorizing Provider  omeprazole (PRILOSEC) 20 MG capsule Take 1 capsule (20 mg total) by mouth daily. 10/22/21  Yes Achille Rich, PA-C  hydrOXYzine (ATARAX/VISTARIL) 25 MG tablet Take 1 tablet (25 mg total) by mouth every 8 (eight) hours as needed for anxiety. 04/18/21   Fayrene Helper, PA-C    Allergies  Patient has no known allergies.  Review of Systems   Review of Systems  Constitutional:  Negative for chills and fever.  HENT:  Negative for congestion, ear pain, rhinorrhea and sore throat.   Eyes:  Negative for pain and visual disturbance.  Respiratory:  Negative for cough and shortness of breath.   Cardiovascular:  Positive for chest pain. Negative for palpitations.  Gastrointestinal:  Negative for abdominal pain, constipation, diarrhea, nausea and vomiting.  Genitourinary:  Negative for dysuria and hematuria.  Musculoskeletal:  Positive for back pain. Negative for arthralgias.  Skin:  Negative for color change and rash.  Neurological:  Negative for seizures, syncope and light-headedness.  All other systems reviewed and are negative.  Physical Exam Updated Vital Signs BP 120/89 (BP Location: Right Arm)   Pulse 70   Temp 98.9 F (37.2 C) (Oral)   Resp 16   LMP 10/19/2021   SpO2  100%   Physical Exam Constitutional:      General: She is not in acute distress.    Appearance: Normal appearance.  HENT:     Head: Normocephalic and atraumatic.  Eyes:     General: No scleral icterus. Cardiovascular:     Rate and Rhythm: Normal rate and regular rhythm.     Heart sounds: Normal heart sounds.  Pulmonary:     Effort: Pulmonary effort is normal. No tachypnea, accessory muscle usage or respiratory distress.     Breath sounds: Normal breath sounds.  Chest:     Chest wall: Tenderness present. No mass, deformity or crepitus.  Abdominal:     General: Abdomen is flat. Bowel sounds are normal.     Palpations: Abdomen is soft.  Musculoskeletal:        General: No deformity.     Cervical back: Normal range of motion.  Skin:    General: Skin is warm and dry.  Neurological:     General: No focal deficit present.     Mental Status: She is alert. Mental status is at baseline.    ED Results / Procedures / Treatments   Labs (all labs ordered are listed, but only abnormal results are displayed) Labs Reviewed  RESP PANEL BY RT-PCR (FLU A&B, COVID) [id number]  BASIC METABOLIC PANEL  CBC  TROPONIN I (HIGH SENSITIVITY)  TROPONIN I (HIGH SENSITIVITY)    EKG EKG Interpretation  Date/Time:  Thursday October 22 2021 12:26:14 EST Ventricular Rate:  85 PR Interval:  152 QRS Duration: 76 QT Interval:  354 QTC Calculation: 421 R Axis:   15 Text Interpretation: Normal sinus rhythm Cannot rule out Anterior infarct , age undetermined Abnormal ECG No significant change since last tracing Confirmed by Regan Lemming ([id number]) on 10/22/2021 6:49:34 PM  Radiology DG Chest Port 1 View  Result Date: 10/22/2021 CLINICAL DATA:  Chest pain, shortness of breath EXAM: PORTABLE CHEST 1 VIEW COMPARISON:  None. FINDINGS: The heart size and mediastinal contours are within normal limits. No focal airspace consolidation, pleural effusion, or pneumothorax. The visualized skeletal structures are  unremarkable. IMPRESSION: No active disease. Electronically Signed   By: Davina Poke D.O.   On: 10/22/2021 12:54    Procedures Procedures   Medications Ordered in ED Medications - No data to display  ED Course  I have reviewed the triage vital signs and the nursing notes.  Pertinent labs & imaging results that were available during my care of the patient were reviewed by me and considered in my medical decision making (see chart for details).  25 year old female  presents with chest pain intermittently for the past 4 years.  Patient has been seen here multiple times for the same complaint from the same incident with a benign work-up.  And that this pain is diffuse, reproducible, this is less likely ACS.  Patient reports that the pain is worsened after eating, likely GI in nature.  Differential diagnosis includes but is not limited to dissection, PE, GERD, ACS, pneumonia, viral illness, costochondritis, MSK.  Personally viewed the patient's labs and imaging.  Negative troponin.  CBC without leukocytosis or anemia.  BMP shows no electrolyte normality.  Chest x-ray shows no acute cardiopulmonary process.  Normal sinus rhythm on EKG.  Patient is PERC negative and low criteria for Wells.  At this time low suspicion for PE.  Negative troponins and normal sinus rhythm on EGD, low suspicion for ACS.  Lung sounds clear to auscultation bilaterally.  Patient denies any URI symptoms.  Eat pneumonia less likely.  Since chest pain is reproducible in nature with tenderness palpation of chest wall, this is likely costochondritis or MSK in conjunction to GERD.  Less likely dissection based on patient presentation and 4-year intermittent duration of symptoms.  Will start patient on omeprazole and refer to a GI practice.  Discussed lab and imaging findings with patient.  Recommended that this is likely GI and not cardiac in nature and that she needs to follow-up with the GI practice as listed on her discharge her  work.  Return precautions discussed.  Patient agrees to plan.  Patient is stable being discharged home in good condition.    MDM Rules/Calculators/A&P  Final Clinical Impression(s) / ED Diagnoses Final diagnoses:  Chest pain, unspecified type    Rx / DC Orders ED Discharge Orders          Ordered    omeprazole (PRILOSEC) 20 MG capsule  Daily        10/22/21 1854             Sherrell Puller, PA-C 10/23/21 0310    Regan Lemming, MD 10/23/21 1517

## 2021-10-22 NOTE — ED Provider Notes (Signed)
Emergency Medicine Provider Triage Evaluation Note  Video translator used during this encounter. Janet [name] , a 25 y.o. female  was evaluated in triage.  Pt complains of body aches chest pain onset 3 days ago, no clear inciting event.  Patient reports chest pain is left-sided aching worsens with certain movements of her arms or with exertion.  Patient reports she feels somewhat short of breath when this happens and improves with rest.  Patient denies radiation of pain.  Patient reports that she was hit with a door around 4 years ago and has experienced intermittent chest pain since that time.  Patient reports that she is also been feeling achy over the past few days as well.  Review of Systems  Positive: Chest pain, body aches Negative: Fever, chills, cough/hemoptysis, exogenous hormone use, surgery/immobilization, extremity swelling/color change, history of blood clot, abdominal pain, vomiting, diarrhea or any additional concerns.  Physical Exam  BP 118/89 (BP Location: Right Arm)   Pulse 77   Temp 99.4 F (37.4 C) (Oral)   Resp 18   LMP 10/19/2021   SpO2 100%  Gen:   Awake, no distress   Resp:  Normal effort, lungs clear to auscultation bilaterally MSK:   Moves extremities without difficulty.  Chest pain increases with arm movement bilaterally Other:  Abdomen soft nontender.  No lower extremity edema or color change.  Medical Decision Making  Medically screening exam initiated at 12:41 PM.  Appropriate orders placed.  Benna Arno was informed that the remainder of the evaluation will be completed by another provider, this initial triage assessment does not replace that evaluation, and the importance of remaining in the ED until their evaluation is complete.   Note: Portions of this report may have been transcribed using voice recognition software. Every effort was made to ensure accuracy; however, inadvertent computerized transcription errors may still be present.    Bill Salinas,  PA-C 10/22/21 1243    Virgina Norfolk, DO 10/22/21 1249

## 2021-10-22 NOTE — ED Triage Notes (Signed)
Pt here c/o back pain and chest pain, pt states she was hit by a door in 2018 and has had intermittent chest and back pain since. This time it started 3 days ago, pt endorses exertional shob, denies N/V

## 2021-11-14 ENCOUNTER — Encounter (HOSPITAL_COMMUNITY): Payer: Self-pay | Admitting: *Deleted

## 2021-11-14 ENCOUNTER — Ambulatory Visit (HOSPITAL_COMMUNITY)
Admission: EM | Admit: 2021-11-14 | Discharge: 2021-11-14 | Disposition: A | Discharge status: Home / Self Care | Payer: Medicaid Other | Attending: Physician Assistant | Admitting: Physician Assistant

## 2021-11-14 ENCOUNTER — Ambulatory Visit (INDEPENDENT_AMBULATORY_CARE_PROVIDER_SITE_OTHER): Payer: Medicaid Other

## 2021-11-14 ENCOUNTER — Other Ambulatory Visit: Payer: Self-pay

## 2021-11-14 DIAGNOSIS — R0789 Other chest pain: Secondary | ICD-10-CM

## 2021-11-14 DIAGNOSIS — J189 Pneumonia, unspecified organism: Secondary | ICD-10-CM | POA: Diagnosis not present

## 2021-11-14 MED ORDER — DOXYCYCLINE HYCLATE 100 MG PO CAPS: 100.0000 mg | ORAL_CAPSULE | Freq: Two times a day (BID) | ORAL | 0 refills | Status: AC

## 2021-11-14 MED ORDER — AMOXICILLIN-POT CLAVULANATE 875-125 MG PO TABS: 1.0000 | ORAL_TABLET | Freq: Two times a day (BID) | ORAL | 0 refills | Status: AC

## 2021-11-14 NOTE — ED Triage Notes (Signed)
Pt reports muscle pain for 1 month and SHOB

## 2021-11-14 NOTE — ED Provider Notes (Signed)
[facility]    CSN: [id number] Arrival date & time: 11/14/21  1055      History   Chief Complaint Chief Complaint  Patient presents with   Muscle Pain    HPI Janet [name] is a 25 y.o. female.   Patient presents today with a several year history of intermittent chest wall pain.  Reports that in 2018 she had an accident at work where a door hit her chest and several months after that she developed ongoing pain.  She reports pain involves her entire thorax including anterior chest and thoracic back.  Pain is intermittent and she is currently asymptomatic but during episodes pain is rated 6 on a 0-10 pain scale, described as aching, worse with activities including physical labor, no alleviating factors identified.  She has been worked up for this several times in the past most recently 10/22/2021 in the emergency room at which point x-ray, troponins, labs were normal and she was prescribed omeprazole.  Patient does not remember taking this medication and is unsure if it was helpful.  She does report some increased anxiety and stress wonders if this could be contributing to symptoms.  She has a prescription for hydroxyzine but has not been using this regularly.   Past Medical History:  Diagnosis Date   Anemia    Depression     Patient Active Problem List   Diagnosis Date Noted   Normal labor 01/09/2020   Oligohydramnios 11/14/2018   Self-inflicted laceration of wrist (HCC)    MDD (major depressive disorder), single episode, severe (HCC) 11/22/2017   Pregnancy 07/16/2015   [redacted] weeks gestation of pregnancy    Encounter for fetal anatomic survey     Past Surgical History:  Procedure Laterality Date   NO PAST SURGERIES      OB History     Gravida  4   Para  4   Term  4   Preterm  0   AB  0   Living  4      SAB  0   IAB  0   Ectopic  0   Multiple  0   Live Births  4            Home Medications    Prior to Admission medications   Medication  Sig Start Date End Date Taking? Authorizing Provider  amoxicillin-clavulanate (AUGMENTIN) 875-125 MG tablet Take 1 tablet by mouth every 12 (twelve) hours for 10 days. 11/14/21 11/24/21 Yes Keianna Signer, Noberto Retort, PA-C  doxycycline (VIBRAMYCIN) 100 MG capsule Take 1 capsule (100 mg total) by mouth 2 (two) times daily. 11/14/21  Yes Laverne Hursey K, PA-C  hydrOXYzine (ATARAX/VISTARIL) 25 MG tablet Take 1 tablet (25 mg total) by mouth every 8 (eight) hours as needed for anxiety. 04/18/21   Fayrene Helper, PA-C  omeprazole (PRILOSEC) 20 MG capsule Take 1 capsule (20 mg total) by mouth daily. 10/22/21   Achille Rich, PA-C    Family History Family History  Problem Relation Age of Onset   Healthy Mother    Healthy Father     Social History Social History   Tobacco Use   Smoking status: Never   Smokeless tobacco: Never  Vaping Use   Vaping Use: Never used  Substance Use Topics   Alcohol use: Yes    Alcohol/week: 1.0 standard drink    Types: 1 Glasses of wine per week    Comment: Occasionally   Drug use: No     Allergies  Patient has no known allergies.   Review of Systems Review of Systems  Constitutional:  Positive for activity change. Negative for appetite change, fatigue and fever.  Respiratory:  Positive for shortness of breath. Negative for cough.   Cardiovascular:  Positive for chest pain (Chest wall).  Gastrointestinal:  Negative for abdominal pain, diarrhea, nausea and vomiting.  Neurological:  Negative for dizziness, light-headedness and headaches.    Physical Exam Triage Vital Signs ED Triage Vitals [11/14/21 1145]  Enc Vitals Group     BP 113/78     Pulse Rate 81     Resp 18     Temp 98.1 F (36.7 C)     Temp src      SpO2 96 %     Weight      Height      Head Circumference      Peak Flow      Pain Score      Pain Loc      Pain Edu?      Excl. in GC?    No data found.  Updated Vital Signs BP 113/78   Pulse 81   Temp 98.1 F (36.7 C)   Resp 18   LMP  10/19/2021   SpO2 96%   Visual Acuity Right Eye Distance:   Left Eye Distance:   Bilateral Distance:    Right Eye Near:   Left Eye Near:    Bilateral Near:     Physical Exam Vitals reviewed.  Constitutional:      General: She is awake. She is not in acute distress.    Appearance: Normal appearance. She is well-developed. She is not ill-appearing.     Comments: Very pleasant female appears stated age in no acute distress sitting comfortably in exam room  HENT:     Head: Normocephalic and atraumatic.  Cardiovascular:     Rate and Rhythm: Normal rate and regular rhythm.     Heart sounds: Normal heart sounds, S1 normal and S2 normal. No murmur heard. Pulmonary:     Effort: Pulmonary effort is normal.     Breath sounds: Normal breath sounds. No wheezing, rhonchi or rales.     Comments: Clear to auscultation bilaterally Chest:     Chest wall: Tenderness present. No deformity or swelling.     Comments: Pain is reproducible on exam.  Tenderness palpation over anterior chest wall. Abdominal:     General: Bowel sounds are normal.     Palpations: Abdomen is soft.     Tenderness: There is no abdominal tenderness. There is no right CVA tenderness, left CVA tenderness, guarding or rebound.  Musculoskeletal:     Cervical back: No tenderness or bony tenderness.     Thoracic back: Tenderness present. No bony tenderness.     Lumbar back: No tenderness or bony tenderness.     Right lower leg: No edema.     Left lower leg: No edema.  Psychiatric:        Behavior: Behavior is cooperative.     UC Treatments / Results  Labs (all labs ordered are listed, but only abnormal results are displayed) Labs Reviewed - No data to display  EKG   Radiology DG Chest 2 View  Result Date: 11/14/2021 CLINICAL DATA:  Chest wall pain that is worsened for 1 months duration in a 25 year old female. EXAM: CHEST - 2 VIEW COMPARISON:  October 22, 2021. FINDINGS: Trachea midline. Cardiomediastinal contours  and hilar structures are stable. Interval development of LEFT  lower lobe airspace disease and vague opacities in the LEFT upper lobe. No pneumothorax. No sign of pleural effusion. On limited assessment there is no acute skeletal process. IMPRESSION: Interval development of LEFT lower lobe airspace disease and vague opacities in the LEFT upper lobe. Findings are suspicious for multifocal pneumonia. Electronically Signed   By: Donzetta Kohut M.D.   On: 11/14/2021 12:25    Procedures Procedures (including critical care time)  Medications Ordered in UC Medications - No data to display  Initial Impression / Assessment and Plan / UC Course  I have reviewed the triage vital signs and the nursing notes.  Pertinent labs & imaging results that were available during my care of the patient were reviewed by me and considered in my medical decision making (see chart for details).     Chest x-ray repeated given associated shortness of breath showed interval development of multifocal pneumonia.  Patient's vital signs are stable and no indication for hospitalization given CRB 65 score of 0.  Patient was started on Augmentin and doxycycline.  Discussed this can upset her stomach so she is to take it with food.  Discussed that she likely has a musculoskeletal component given tenderness palpation on exam and she can use over-the-counter medications for symptom relief.  She does not currently have a primary care provider so we will try to establish her with 1 via PCP assistance.  Discussed that if she has any worsening symptoms including shortness of breath, persistent pain, fever, nausea, vomiting she needs to go to the emergency room.  Recommended follow-up with PCP or our clinic if unable to be seen by PCP within a few weeks.  Strict return precautions given to which she expressed understanding.  Final Clinical Impressions(s) / UC Diagnoses   Final diagnoses:  Chest wall pain  Multifocal pneumonia      Discharge Instructions      Your x-ray showed that you possibly have pneumonia.  We are going to treat you with antibiotics.  Please follow-up with either our clinic or a primary care office within a week.  We will try to establish you with someone and they should call you to schedule an appointment.  If you have any worsening symptoms including high fever, increased pain, shortness of breath you need to go to the emergency room.     ED Prescriptions     Medication Sig Dispense Auth. Provider   amoxicillin-clavulanate (AUGMENTIN) 875-125 MG tablet Take 1 tablet by mouth every 12 (twelve) hours for 10 days. 20 tablet Holli Rengel K, PA-C   doxycycline (VIBRAMYCIN) 100 MG capsule Take 1 capsule (100 mg total) by mouth 2 (two) times daily. 20 capsule Alexxis Mackert, Noberto Retort, PA-C      PDMP not reviewed this encounter.   Jeani Hawking, PA-C 11/14/21 1250

## 2021-11-14 NOTE — Discharge Instructions (Signed)
Your x-ray showed that you possibly have pneumonia.  We are going to treat you with antibiotics.  Please follow-up with either our clinic or a primary care office within a week.  We will try to establish you with someone and they should call you to schedule an appointment.  If you have any worsening symptoms including high fever, increased pain, shortness of breath you need to go to the emergency room.

## 2021-11-21 ENCOUNTER — Ambulatory Visit (INDEPENDENT_AMBULATORY_CARE_PROVIDER_SITE_OTHER): Payer: Medicaid Other

## 2021-11-21 ENCOUNTER — Other Ambulatory Visit: Payer: Self-pay

## 2021-11-21 ENCOUNTER — Ambulatory Visit (HOSPITAL_COMMUNITY)
Admission: EM | Admit: 2021-11-21 | Discharge: 2021-11-21 | Disposition: A | Discharge status: Home / Self Care | Payer: Medicaid Other | Attending: Emergency Medicine | Admitting: Emergency Medicine

## 2021-11-21 ENCOUNTER — Encounter (HOSPITAL_COMMUNITY): Payer: Self-pay | Admitting: *Deleted

## 2021-11-21 DIAGNOSIS — R059 Cough, unspecified: Secondary | ICD-10-CM

## 2021-11-21 DIAGNOSIS — R0602 Shortness of breath: Secondary | ICD-10-CM | POA: Diagnosis not present

## 2021-11-21 NOTE — Discharge Instructions (Addendum)
If your shortness of breath worsens, please go to Emergency Department.

## 2021-11-21 NOTE — ED Provider Notes (Signed)
[facility]  ____________________________________________  Time seen: Approximately 7:09 PM  I have reviewed the triage vital signs and the nursing notes.   HISTORY  Chief Complaint Pneumonia, Shortness of Breath, and Spasms   Historian Patient    HPI Janet [name] is a 25 y.o. female with a history of anemia and depression, presents to the urgent care to have a repeat chest x-ray as she was diagnosed with pneumonia on 12 3.  Patient still has 3 more days of treatment left.  She states that she still has some mild shortness of breath and anterior chest wall pain.  She also states that she has twitching of the skin across her entire body and wants to know if that is normal.   Past Medical History:  Diagnosis Date   Anemia    Depression      Immunizations up to date:  Yes.     Past Medical History:  Diagnosis Date   Anemia    Depression     Patient Active Problem List   Diagnosis Date Noted   Normal labor 01/09/2020   Oligohydramnios 11/14/2018   Self-inflicted laceration of wrist (HCC)    MDD (major depressive disorder), single episode, severe (HCC) 11/22/2017   Pregnancy 07/16/2015   [redacted] weeks gestation of pregnancy    Encounter for fetal anatomic survey     Past Surgical History:  Procedure Laterality Date   NO PAST SURGERIES      Prior to Admission medications   Medication Sig Start Date End Date Taking? Authorizing Provider  amoxicillin-clavulanate (AUGMENTIN) 875-125 MG tablet Take 1 tablet by mouth every 12 (twelve) hours for 10 days. 11/14/21 11/24/21 Yes Raspet, Noberto Retort, PA-C  doxycycline (VIBRAMYCIN) 100 MG capsule Take 1 capsule (100 mg total) by mouth 2 (two) times daily. 11/14/21  Yes Raspet, Erin K, PA-C  hydrOXYzine (ATARAX/VISTARIL) 25 MG tablet Take 1 tablet (25 mg total) by mouth every 8 (eight) hours as needed for anxiety. 04/18/21   Fayrene Helper, PA-C  omeprazole (PRILOSEC) 20 MG capsule Take 1 capsule (20 mg total) by mouth daily.  10/22/21   Achille Rich, PA-C    Allergies Patient has no known allergies.  Family History  Problem Relation Age of Onset   Healthy Mother    Healthy Father     Social History Social History   Tobacco Use   Smoking status: Never   Smokeless tobacco: Never  Vaping Use   Vaping Use: Never used  Substance Use Topics   Alcohol use: Yes    Alcohol/week: 1.0 standard drink    Types: 1 Glasses of wine per week    Comment: Occasionally   Drug use: No     Review of Systems  Constitutional: No fever/chills Eyes:  No discharge ENT: No upper respiratory complaints. Respiratory: Patient has cough.  Gastrointestinal:   No nausea, no vomiting.  No diarrhea.  No constipation. Musculoskeletal: Negative for musculoskeletal pain. Skin: Negative for rash, abrasions, lacerations, ecchymosis.    ____________________________________________   PHYSICAL EXAM:  VITAL SIGNS: ED Triage Vitals  Enc Vitals Group     BP 11/21/21 1752 116/62     Pulse Rate 11/21/21 1752 73     Resp 11/21/21 1752 18     Temp 11/21/21 1752 99.1 F (37.3 C)     Temp src --      SpO2 11/21/21 1752 100 %     Weight --      Height --      Head  Circumference --      Peak Flow --      Pain Score 11/21/21 1749 8     Pain Loc --      Pain Edu? --      Excl. in GC? --      Constitutional: Alert and oriented. Well appearing and in no acute distress. Eyes: Conjunctivae are normal. PERRL. EOMI. Head: Atraumatic. ENT:      Nose: No congestion/rhinnorhea.      Mouth/Throat: Mucous membranes are moist.  Neck: No stridor.  No cervical spine tenderness to palpation. Cardiovascular: Normal rate, regular rhythm. Normal S1 and S2.  Good peripheral circulation. Respiratory: Normal respiratory effort without tachypnea or retractions. Lungs CTAB. Good air entry to the bases with no decreased or absent breath sounds Gastrointestinal: Bowel sounds x 4 quadrants. Soft and nontender to palpation. No guarding or  rigidity. No distention. Musculoskeletal: Full range of motion to all extremities. No obvious deformities noted Neurologic:  Normal for age. No gross focal neurologic deficits are appreciated.  Skin:  Skin is warm, dry and intact. No rash noted. Psychiatric: Mood and affect are normal for age. Speech and behavior are normal.   ____________________________________________   LABS (all labs ordered are listed, but only abnormal results are displayed)  Labs Reviewed - No data to display ____________________________________________  EKG   ____________________________________________  RADIOLOGY Geraldo Pitter, personally viewed and evaluated these images (plain radiographs) as part of my medical decision making, as well as reviewing the written report by the radiologist.  DG Chest 2 View  Result Date: 11/21/2021 CLINICAL DATA:  Cough, shortness of breath EXAM: CHEST - 2 VIEW COMPARISON:  11/14/2021 FINDINGS: Heart and mediastinal contours are within normal limits. No focal opacities or effusions. No acute bony abnormality. IMPRESSION: No active cardiopulmonary disease. Electronically Signed   By: Charlett Nose M.D.   On: 11/21/2021 18:14    ____________________________________________    PROCEDURES  Procedure(s) performed:     Procedures     Medications - No data to display   ____________________________________________   INITIAL IMPRESSION / ASSESSMENT AND PLAN / ED COURSE  Pertinent labs & imaging results that were available during my care of the patient were reviewed by me and considered in my medical decision making (see chart for details).    Assessment and plan Shortness of breath: 25 year old female presents to the urgent care for repeat chest x-ray which showed no signs of pneumonia.  Advised patient to continue taking her antibiotic as directed.  She was cautioned that should she have worsening chest pain, chest tightness or shortness of breath, she should  seek care at local emergency department.  Advised patient to increase her hydration at home, to get plenty of sleep and to reduce caffeine intake.      ____________________________________________  FINAL CLINICAL IMPRESSION(S) / ED DIAGNOSES  Final diagnoses:  Shortness of breath      NEW MEDICATIONS STARTED DURING THIS VISIT:  ED Discharge Orders     None           This chart was dictated using voice recognition software/Dragon. Despite best efforts to proofread, errors can occur which can change the meaning. Any change was purely unintentional.     Orvil Feil, PA-C 11/21/21 1912

## 2021-11-21 NOTE — ED Triage Notes (Signed)
Pt reports she was seen 9-10 days ago and was treated for PNA. Pt took medication but still has SHOB ,Muscle spasam. Pt asking for chest x-ray.

## 2021-12-02 ENCOUNTER — Ambulatory Visit: Payer: Medicaid Other | Admitting: Nurse Practitioner

## 2023-05-30 IMAGING — DX DG CHEST 2V
2 series · 2 of 2 positions shown · non-contrast
Comparison: 11/14/2021

CLINICAL DATA: Cough, shortness of breath

EXAM:
CHEST - 2 VIEW

[chest pa]
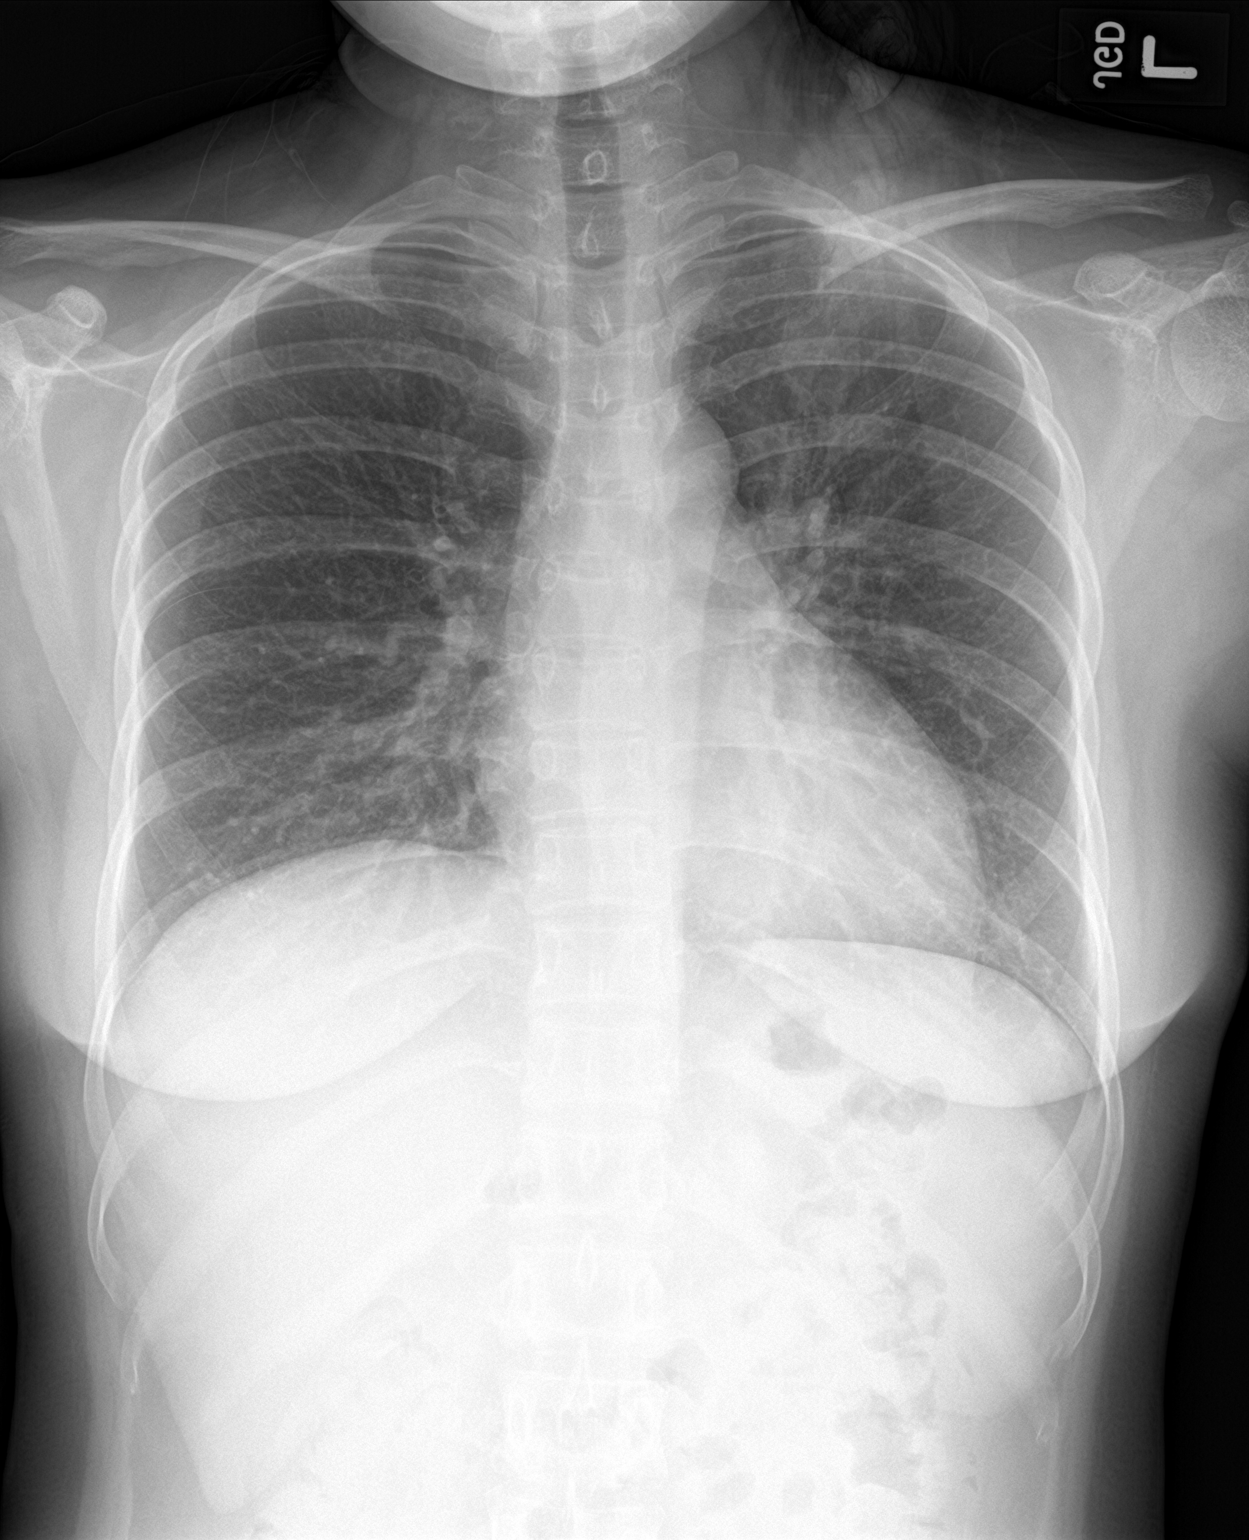

[chest lat]
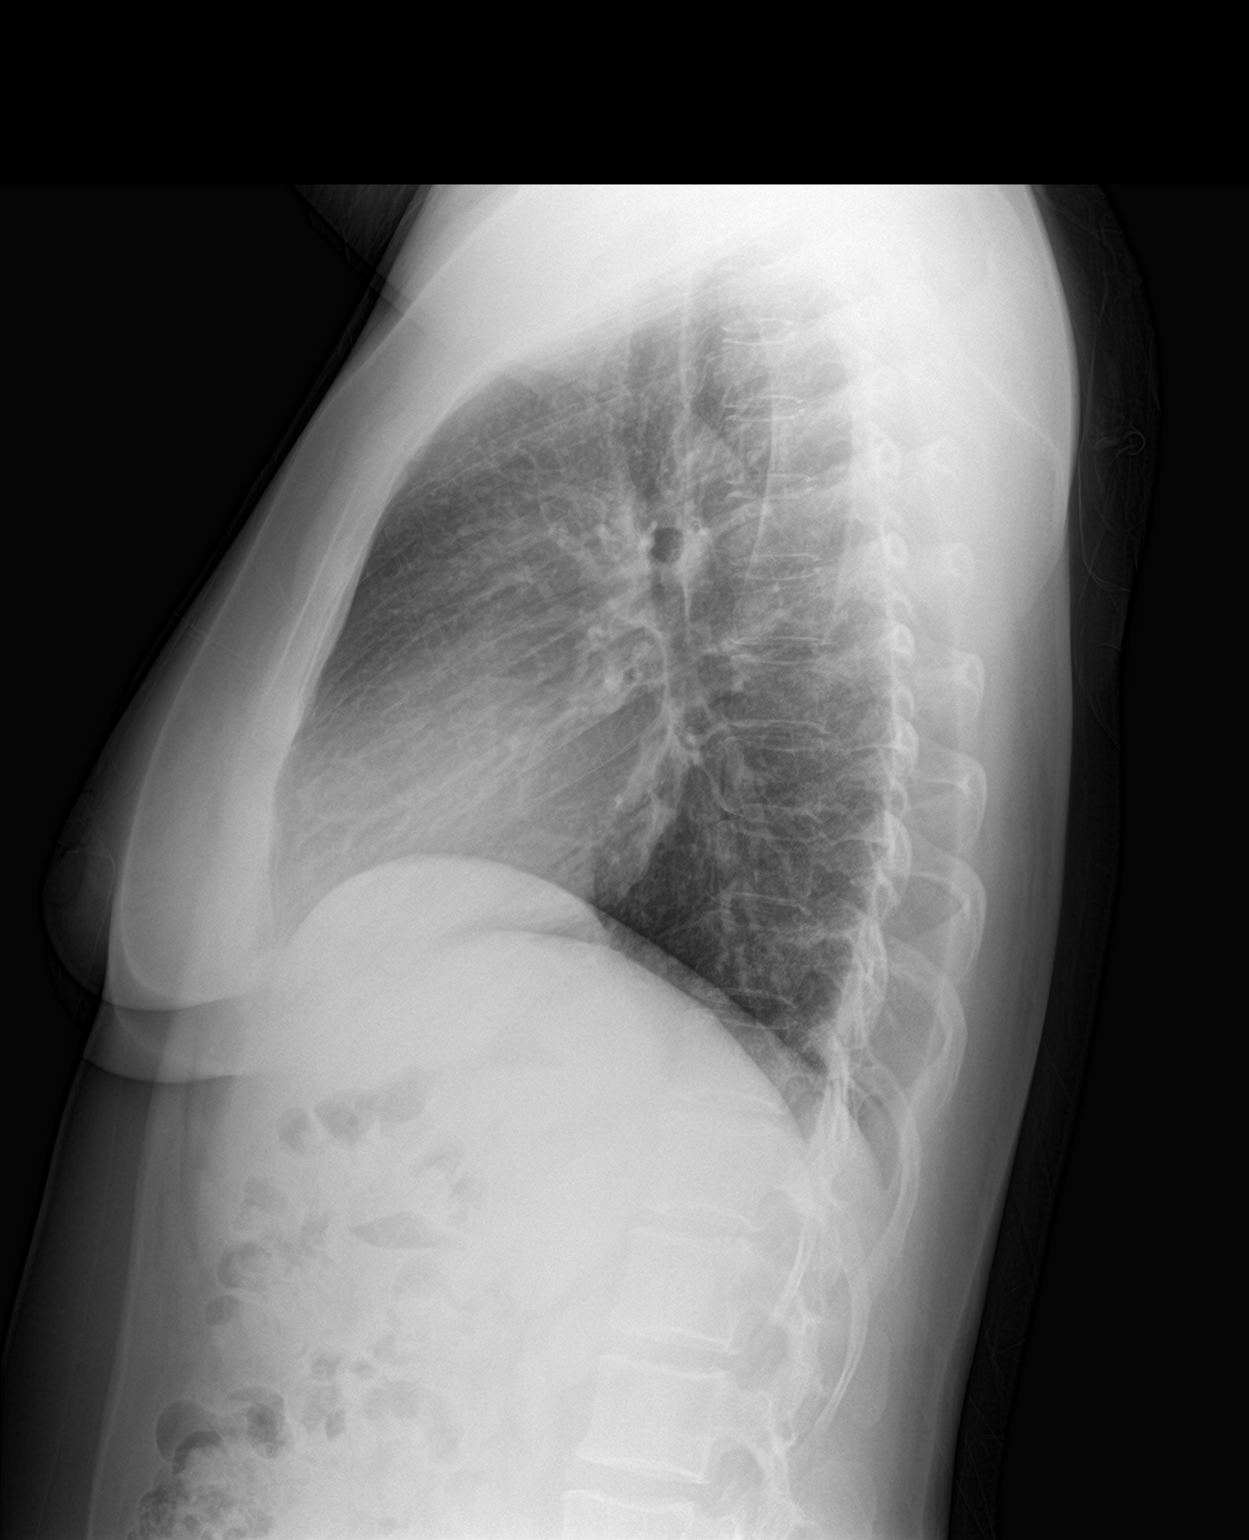

[2 of 2 positions shown; findings below may reference images not displayed]

FINDINGS: Heart and mediastinal contours are within normal limits. No focal
opacities or effusions. No acute bony abnormality.
IMPRESSION: No active cardiopulmonary disease.
